# Patient Record
Sex: Female | Born: 1950 | Race: White | Hispanic: No | Marital: Married | Smoking: Never smoker
Health system: Southern US, Community
[De-identification: ages and names within clinical notes are randomized; demographics above are authoritative.]

## PROBLEM LIST (undated history)

## (undated) DIAGNOSIS — N390 Urinary tract infection, site not specified: Secondary | ICD-10-CM

## (undated) DIAGNOSIS — O24419 Gestational diabetes mellitus in pregnancy, unspecified control: Secondary | ICD-10-CM

## (undated) DIAGNOSIS — A389 Scarlet fever, uncomplicated: Secondary | ICD-10-CM

## (undated) DIAGNOSIS — Z8619 Personal history of other infectious and parasitic diseases: Secondary | ICD-10-CM

## (undated) DIAGNOSIS — B019 Varicella without complication: Secondary | ICD-10-CM

## (undated) DIAGNOSIS — G43909 Migraine, unspecified, not intractable, without status migrainosus: Secondary | ICD-10-CM

## (undated) DIAGNOSIS — K9041 Non-celiac gluten sensitivity: Secondary | ICD-10-CM

## (undated) DIAGNOSIS — Z889 Allergy status to unspecified drugs, medicaments and biological substances status: Secondary | ICD-10-CM

## (undated) DIAGNOSIS — K269 Duodenal ulcer, unspecified as acute or chronic, without hemorrhage or perforation: Secondary | ICD-10-CM

## (undated) HISTORY — DX: Non-celiac gluten sensitivity: K90.41

## (undated) HISTORY — DX: Personal history of other infectious and parasitic diseases: Z86.19

## (undated) HISTORY — DX: Scarlet fever, uncomplicated: A38.9

## (undated) HISTORY — DX: Varicella without complication: B01.9

## (undated) HISTORY — DX: Migraine, unspecified, not intractable, without status migrainosus: G43.909

## (undated) HISTORY — DX: Urinary tract infection, site not specified: N39.0

## (undated) HISTORY — DX: Allergy status to unspecified drugs, medicaments and biological substances: Z88.9

## (undated) HISTORY — DX: Duodenal ulcer, unspecified as acute or chronic, without hemorrhage or perforation: K26.9

## (undated) HISTORY — DX: Gestational diabetes mellitus in pregnancy, unspecified control: O24.419

---

## 2003-08-13 ENCOUNTER — Other Ambulatory Visit: Admission: RE | Admit: 2003-08-13 | Discharge: 2003-08-13 | Payer: Self-pay | Admitting: Obstetrics and Gynecology

## 2006-02-08 ENCOUNTER — Ambulatory Visit: Payer: Self-pay | Admitting: Internal Medicine

## 2015-07-28 ENCOUNTER — Telehealth: Payer: Self-pay | Admitting: Family Medicine

## 2015-07-28 NOTE — Telephone Encounter (Signed)
Dr. Regis Bill has agreed to see Destiny Cruz as a new pt in the Fall.  Please call the pt at [phone] to make that appointment.  Thanks!

## 2015-07-30 NOTE — Telephone Encounter (Signed)
Pt has been sch and req dec 2016

## 2015-12-03 ENCOUNTER — Ambulatory Visit (INDEPENDENT_AMBULATORY_CARE_PROVIDER_SITE_OTHER): Payer: PPO | Admitting: Internal Medicine

## 2015-12-03 ENCOUNTER — Encounter: Payer: Self-pay | Admitting: Internal Medicine

## 2015-12-03 VITALS — BP 144/90 | Temp 97.7°F | Ht 61.0 in | Wt 134.9 lb

## 2015-12-03 DIAGNOSIS — Z800 Family history of malignant neoplasm of digestive organs: Secondary | ICD-10-CM

## 2015-12-03 DIAGNOSIS — Z23 Encounter for immunization: Secondary | ICD-10-CM

## 2015-12-03 DIAGNOSIS — Z1211 Encounter for screening for malignant neoplasm of colon: Secondary | ICD-10-CM

## 2015-12-03 DIAGNOSIS — IMO0001 Reserved for inherently not codable concepts without codable children: Secondary | ICD-10-CM

## 2015-12-03 DIAGNOSIS — K9041 Non-celiac gluten sensitivity: Secondary | ICD-10-CM

## 2015-12-03 DIAGNOSIS — M810 Age-related osteoporosis without current pathological fracture: Secondary | ICD-10-CM

## 2015-12-03 DIAGNOSIS — K900 Celiac disease: Secondary | ICD-10-CM | POA: Diagnosis not present

## 2015-12-03 DIAGNOSIS — Z8632 Personal history of gestational diabetes: Secondary | ICD-10-CM

## 2015-12-03 DIAGNOSIS — E559 Vitamin D deficiency, unspecified: Secondary | ICD-10-CM

## 2015-12-03 DIAGNOSIS — R030 Elevated blood-pressure reading, without diagnosis of hypertension: Secondary | ICD-10-CM

## 2015-12-03 DIAGNOSIS — E7800 Pure hypercholesterolemia, unspecified: Secondary | ICD-10-CM

## 2015-12-03 NOTE — Progress Notes (Signed)
Pre visit review using our clinic review tool, if applicable. No additional management support is needed unless otherwise documented below in the visit note.  Chief Complaint  Patient presents with  . Establish Care    ostoporosis prev pcp gyne     HPI:  Nailah Luepke 64 y.o. comes in today for  New patient visit t. Her mom who has rheumatoid arthritis is a patient in our practice. Her previous care was from her GYN Dr. Irven [place] who she last saw in about 2011. Since then she has done quite well. She carries a diagnosis of osteoporosis at a fairly early age without a history of fracture. Thorough evaluation for secondary causes showed a low vitamin D level of 11 and presumed celiac disease or at least gluten sensitivity based on family history and change of symptoms with diet change. Her last bones density was about 5 years ago. Records are available for review. She was given bisphosphonate around 2004 fosomax and then actonel .  monthly  She does have a history of gestational diabetes and is working on this to prevent a problem but has not had lab blood tests done in years.  Lost  12 pounds + since august to exercise and  Low carb  Since august .   Currently she does get GI symptoms and perhaps joint symptoms when she eats gluten or goes off her dietary restriction. Of note her son when he was 37 or so was evaluated for celiac disease and although the biopsy was inconclusive it was felt he had celiac by Dr. Berdine Addison GI pediatric [facility]. Her daughter also developed symptoms when she was older but no evaluation. She is chosen to not have endoscopy and biopsy but to continue on her dietary intervention because it is made her feel some much better. She is exercising regularly eating healthy as possible states she has fewer sinus infections since eating correctly.  She has never had an endoscopy but was told she probably had a peptic ulcer while ulcer years ago by Dr. Tomma Rakers because she had  epigastric pain that radiated to her back she responded quite well to medication since she has been off an endoscopy was not felt to be necessary. She hasn't had recurrence of this problem.  She chooses to not have a colonoscopy her father had colon cancer but did not die from it had surgery to correct it had had anemia for while and he had refused colonoscopy until he passed out. He was also a patient in our practice. She has done stool cards in the past is amenable to that but hasn't done this in a few years.  Has not had a mammogram for a while asks about this.  Health Maintenance  Topic Date Due  . Hepatitis C Screening  02/12/1951  . ZOSTAVAX  09/22/2009  . MAMMOGRAM  10/23/2009  . COLONOSCOPY  12/02/2016 (Originally 09/23/1999)  . INFLUENZA VACCINE  07/06/2016  . PNA vac Low Risk Adult (2 of 2 - PPSV23) 12/02/2016  . TETANUS/TDAP  11/19/2017  . DEXA SCAN  Completed   Health Maintenance Review LIFESTYLE:  TAD social no more than one per day Exercise 3 d per week  Sugar beverages: No Sleep: 7-8 hours hh of 3  G2P2 Hx ascus pap neg hpv hr 2007    ROS: donantes blood  GEN/ HEENT: No fever, significant weight changes sweats headaches vision problems hearing changes, CV/ PULM; No chest pain shortness of breath cough, syncope,edema  change in  exercise tolerance. GI /GU: No adominal pain, vomiting, change in bowel habits. No blood in the stool. No significant GU symptoms. SKIN/HEME: ,no acute skin rashes suspicious lesions or bleeding. No lymphadenopathy, nodules, masses.  NEURO/ PSYCH:  No neurologic signs such as weakness numbness. No depression anxiety. IMM/ Allergy: No unusual infections.  Allergy .   REST of 12 system review negative except as per HPI   Past Medical History  Diagnosis Date  . Chicken pox   . Gestational diabetes   . Duodenal ulcer     clinical dx when younger responded to med and no recurrance no bleeding  . Migraines   . UTI (lower urinary tract  infection)     once   . Gluten intolerance     possible celiac  see text   . Hx of allergy     as a child hay fever     Family History  Problem Relation Age of Onset  . Colon cancer Father   . Rheum arthritis Mother   . Stroke Father   . Thyroid disease Daughter     partial thyroidectomy   . Celiac disease Son     probable    Social History   Social History  . Marital Status: Married    Spouse Name: N/A  . Number of Children: N/A  . Years of Education: N/A   Occupational History  . Retired    Social History Main Topics  . Smoking status: Never Smoker   . Smokeless tobacco: Never Used  . Alcohol Use: 0.0 oz/week    0 Standard drinks or equivalent per week     Comment: 1-2 Glasses of wine per day  . Drug Use: No  . Sexual Activity:    Partners: Male   Other Topics Concern  . 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 Cholecalciferol (VITAMIN D-3 PO) Take 4,000 Units by mouth daily.   No facility-administered encounter medications on file as of 12/03/2015.    EXAM:  BP 144/90 mmHg  Temp(Src) 97.7 F (36.5 C) (Oral)  Ht 5' 1"  (1.549 m)  Wt 134 lb 14.4 oz (61.19 kg)  BMI 25.50 kg/m2  Body mass index is 25.5 kg/(m^2).  Physical Exam: Vital signs reviewed [id number] is a well-developed well-nourished alert cooperative   who appears stated age in no acute distress.  HEENT: normocephalic atraumatic , Eyes: PERRL EOM's full, conjunctiva clear, Nares: paten,t no deformity discharge or tenderness., Ears: no deformityNECK: supple without masses, thyromegaly or bruits. CHEST/PULM:  Clear to auscultation and percussion breath sounds equal no wheeze , rales or rhonchi. CV: PMI is nondisplaced, S1 S2 no gallops, murmurs, rubs. Peripheral  pulses are full without delay.No JVD .  ABDOMEN: Bowel sounds normal nontender  No guard or rebound, no hepato splenomegal no CVA tenderness.   Extremtities:  No clubbing cyanosis or edema, no acute joint swelling or redness no focal atrophy NEURO:  Oriented x3, cranial nerves 3-12 appear to be intact, no obvious focal weakness,gait within normal limits  SKIN: No acute rashes normal turgor, color, no bruising or petechiae. PSYCH: Oriented, good eye contact, no obvious depression anxiety, cognition and judgment appear normal. LN: no cervical  adenopathy No noted  deficits in memory, attention, and speech.   No results found for: WBC, HGB, HCT, PLT, GLUCOSE, CHOL, TRIG, HDL, LDLDIRECT, LDLCALC, ALT, AST, NA, K, CL, CREATININE, BUN, CO2, TSH, PSA, INR, GLUF, HGBA1C, MICROALBUR Cholesterol up slightly records 230 range  Last pap 2011 neg  Last dexa se radiology 2011? -3.1 t score l 4-5  Td 12 15 2008 ASSESSMENT AND PLAN:  Discussed the following assessment and plan:  Osteoporosis - Plan: DG Bone Density, Basic metabolic panel, CBC with Differential/Platelet, Hemoglobin A1c, Hepatic function panel, Lipid panel, TSH, VITAMIN D 25 Hydroxy (Vit-D Deficiency, Fractures)  Colon cancer screening - Plan: Fecal occult blood, imunochemical  Gluten intolerance - poss celiac see text  - Plan: Basic metabolic panel, CBC with Differential/Platelet, Hemoglobin A1c, Hepatic function panel, Lipid panel, TSH, VITAMIN D 25 Hydroxy (Vit-D Deficiency, Fractures)  Need for vaccination with 13-polyvalent pneumococcal conjugate vaccine - Plan: Pneumococcal conjugate vaccine 13-valent  Hx gestational diabetes - monitor metabolic lsi  - Plan: Hemoglobin A1c  Elevated BP - check at home   intervention if needed to control - Plan: Basic metabolic panel  Vitamin D deficiency - Plan: VITAMIN D 25 Hydroxy (Vit-D Deficiency, Fractures)  Elevated cholesterol - Plan: Basic metabolic panel, Hepatic function panel, Lipid  panel  Family history of colon cancer father - pt decline colonscopy   Patient Care Team: Burnis Medin, MD as PCP - General (Internal Medicine) Clent Jacks, MD as Consulting Physician (Ophthalmology) Total visit 76mns > 50% spent counseling and coordinating care as indicated in above note and in instructions to patient .  Review of data with patient and plan     Patient Instructions  Get dexa  Scan appt.  Get a screening mammogram IFOB stool cards yearly for screening. Will  review records and plan  Fasting labs and then wellness exam  After dexa report back.  prevnar 13 . Today ..Marland Kitchen     WStandley Brooking Panosh M.D.

## 2015-12-03 NOTE — Patient Instructions (Signed)
Get dexa  Scan appt.  Get a screening mammogram IFOB stool cards yearly for screening. Will  review records and plan  Fasting labs and then wellness exam  After dexa report back.  prevnar 13 . Today .Marland Kitchen

## 2015-12-08 ENCOUNTER — Encounter: Payer: Self-pay | Admitting: Internal Medicine

## 2015-12-08 DIAGNOSIS — E559 Vitamin D deficiency, unspecified: Secondary | ICD-10-CM | POA: Insufficient documentation

## 2015-12-08 DIAGNOSIS — Z800 Family history of malignant neoplasm of digestive organs: Secondary | ICD-10-CM | POA: Insufficient documentation

## 2015-12-09 LAB — FECAL OCCULT BLOOD, IMMUNOCHEMICAL: Fecal Occult Bld: NEGATIVE

## 2015-12-09 NOTE — Progress Notes (Signed)
Quick Note:  Inform patient stool test negative for blood . Recheck this test yearly for screening colon cancer ______

## 2015-12-11 ENCOUNTER — Ambulatory Visit (INDEPENDENT_AMBULATORY_CARE_PROVIDER_SITE_OTHER)
Admission: RE | Admit: 2015-12-11 | Discharge: 2015-12-11 | Disposition: A | Discharge status: Home / Self Care | Payer: PPO | Source: Ambulatory Visit | Attending: Internal Medicine | Admitting: Internal Medicine

## 2015-12-11 DIAGNOSIS — M810 Age-related osteoporosis without current pathological fracture: Secondary | ICD-10-CM

## 2015-12-18 NOTE — Assessment & Plan Note (Signed)
dexa -3. Hip

## 2016-01-28 ENCOUNTER — Encounter: Payer: Self-pay | Admitting: Internal Medicine

## 2016-03-24 ENCOUNTER — Other Ambulatory Visit (INDEPENDENT_AMBULATORY_CARE_PROVIDER_SITE_OTHER): Payer: PPO

## 2016-03-24 DIAGNOSIS — R030 Elevated blood-pressure reading, without diagnosis of hypertension: Secondary | ICD-10-CM

## 2016-03-24 DIAGNOSIS — Z8632 Personal history of gestational diabetes: Secondary | ICD-10-CM

## 2016-03-24 DIAGNOSIS — IMO0001 Reserved for inherently not codable concepts without codable children: Secondary | ICD-10-CM

## 2016-03-24 DIAGNOSIS — E559 Vitamin D deficiency, unspecified: Secondary | ICD-10-CM

## 2016-03-24 DIAGNOSIS — M810 Age-related osteoporosis without current pathological fracture: Secondary | ICD-10-CM

## 2016-03-24 DIAGNOSIS — E7800 Pure hypercholesterolemia, unspecified: Secondary | ICD-10-CM

## 2016-03-24 DIAGNOSIS — K900 Celiac disease: Secondary | ICD-10-CM

## 2016-03-24 DIAGNOSIS — K9041 Non-celiac gluten sensitivity: Secondary | ICD-10-CM

## 2016-03-24 LAB — CBC WITH DIFFERENTIAL/PLATELET
Basophils Absolute: 0 10*3/uL (ref 0.0–0.1)
Basophils Relative: 0.7 % (ref 0.0–3.0)
Eosinophils Absolute: 0.2 10*3/uL (ref 0.0–0.7)
Eosinophils Relative: 3.9 % (ref 0.0–5.0)
HCT: 41.6 % (ref 36.0–46.0)
Hemoglobin: 14 g/dL (ref 12.0–15.0)
Lymphocytes Relative: 21.1 % (ref 12.0–46.0)
Lymphs Abs: 1.2 10*3/uL (ref 0.7–4.0)
MCHC: 33.7 g/dL (ref 30.0–36.0)
MCV: 93 fl (ref 78.0–100.0)
Monocytes Absolute: 0.5 10*3/uL (ref 0.1–1.0)
Monocytes Relative: 9.7 % (ref 3.0–12.0)
Neutro Abs: 3.5 10*3/uL (ref 1.4–7.7)
Neutrophils Relative %: 64.6 % (ref 43.0–77.0)
Platelets: 180 10*3/uL (ref 150.0–400.0)
RBC: 4.48 Mil/uL (ref 3.87–5.11)
RDW: 13.1 % (ref 11.5–15.5)
WBC: 5.5 10*3/uL (ref 4.0–10.5)

## 2016-03-24 LAB — BASIC METABOLIC PANEL
BUN: 12 mg/dL (ref 6–23)
CO2: 29 mEq/L (ref 19–32)
Calcium: 9.7 mg/dL (ref 8.4–10.5)
Chloride: 105 mEq/L (ref 96–112)
Creatinine, Ser: 0.75 mg/dL (ref 0.40–1.20)
GFR: 82.05 mL/min (ref >60.00)
Glucose, Bld: 90 mg/dL (ref 70–99)
Potassium: 4.5 mEq/L (ref 3.5–5.1)
Sodium: 141 mEq/L (ref 135–145)

## 2016-03-24 LAB — HEPATIC FUNCTION PANEL
ALT: 12 U/L (ref 0–35)
AST: 12 U/L (ref 0–37)
Albumin: 4.2 g/dL (ref 3.5–5.2)
Alkaline Phosphatase: 66 U/L (ref 39–117)
Bilirubin, Direct: 0.1 mg/dL (ref 0.0–0.3)
Total Bilirubin: 0.5 mg/dL (ref 0.2–1.2)
Total Protein: 6.7 g/dL (ref 6.0–8.3)

## 2016-03-24 LAB — LIPID PANEL
Cholesterol: 229 mg/dL — ABNORMAL HIGH (ref 0–200)
HDL: 57.8 mg/dL (ref >39.00)
LDL Cholesterol: 152 mg/dL — ABNORMAL HIGH (ref 0–99)
NonHDL: 171.51
Total CHOL/HDL Ratio: 4
Triglycerides: 98 mg/dL (ref 0.0–149.0)
VLDL: 19.6 mg/dL (ref 0.0–40.0)

## 2016-03-24 LAB — HEMOGLOBIN A1C: Hgb A1c MFr Bld: 5.1 % (ref 4.6–6.5)

## 2016-03-24 LAB — TSH: TSH: 1.85 u[IU]/mL (ref 0.35–4.50)

## 2016-03-24 LAB — VITAMIN D 25 HYDROXY (VIT D DEFICIENCY, FRACTURES): VITD: 81.82 ng/mL (ref 30.00–100.00)

## 2016-03-30 NOTE — Progress Notes (Signed)
Pre visit review using our clinic review tool, if applicable. No additional management support is needed unless otherwise documented below in the visit note.  Chief Complaint  Patient presents with  . Medicare Wellness    bp up today cause of stress has been coming down    HPI: Destiny Cruz 65 y.o. comes in today for Preventive Medicare wellness visit .Since last visit  bp is up cause of stress.  Husband under eval for neuro sx  Getting mri now.   Stopped  Wine was up to 1 bottle per night    This month  And  More than usually   bp coming down  In 140 150 ocass 120 range at home . exercising losing weight  With diet changes . Retired last fall and doing well with this   Health Maintenance  Topic Date Due  . ZOSTAVAX  09/22/2009  . MAMMOGRAM  10/23/2009  . COLONOSCOPY  12/02/2016 (Originally 09/23/1999)  . Hepatitis C Screening  03/31/2017 (Originally 01-23-1951)  . INFLUENZA VACCINE  07/06/2016  . PNA vac Low Risk Adult (2 of 2 - PPSV23) 12/02/2016  . TETANUS/TDAP  11/19/2017  . DEXA SCAN  Completed   Health Maintenance Review LIFESTYLE:  TAD no  Cut back on  Wine  Sugar beverages: no hx gestational dm .  Sleep: about 7-8 hours   Exercise   Every day   3 miles .   17 pound weight loss    eating different  MEDICARE DOCUMENT QUESTIONS  TO SCAN     Hearing: ok  Vision:  No limitations at present . Last eye check UTD  Safety:  Has smoke detector and wears seat belts.  No firearms. No excess sun exposure. Sees dentist regularly.  Falls: n  Advance directive :  Reviewed  Has one.  Memory: Felt to be good  , no concern from her or her family.  Depression: No anhedonia unusual crying or depressive symptoms  Nutrition: Eats well balanced diet; adequate calcium and vitamin D. No swallowing chewing problems.  Injury: no major injuries in the last six months.  Other healthcare providers:  Reviewed today .  Social:  Lives with spouse married. No pets.    Preventive parameters: up-to-date  Reviewed   ADLS:   There are no problems or need for assistance  driving, feeding, obtaining food, dressing, toileting and bathing, managing money using phone. She is independent.     ROS:  GEN/ HEENT: No fever, significant weight changes sweats headaches vision problems hearing changes, CV/ PULM; No chest pain shortness of breath cough, syncope,edema  change in exercise tolerance. GI /GU: No adominal pain, vomiting, change in bowel habits. No blood in the stool. No significant GU symptoms. SKIN/HEME: ,no acute skin rashes suspicious lesions or bleeding. No lymphadenopathy, nodules, masses.  NEURO/ PSYCH:  No neurologic signs such as weakness numbness. No depression anxiety. IMM/ Allergy: No unusual infections.  Allergy .   REST of 12 system review negative except as per HPI   Past Medical History  Diagnosis Date  . Chicken pox   . Gestational diabetes   . Duodenal ulcer     clinical dx when younger responded to med and no recurrance no bleeding  . Migraines   . UTI (lower urinary tract infection)     once   . Gluten intolerance     possible celiac  see text   . Hx of allergy     as a child hay fever   .  Pre visit review using our clinic review tool, if applicable. No additional management support is needed unless otherwise documented below in the visit note.  Chief Complaint  Patient presents with  . Medicare Wellness    bp up today cause of stress has been coming down    HPI: Destiny Cruz 65 y.o. comes in today for Preventive Medicare wellness visit .Since last visit  bp is up cause of stress.  Husband under eval for neuro sx  Getting mri now.   Stopped  Wine was up to 1 bottle per night    This month  And  More than usually   bp coming down  In 140 150 ocass 120 range at home . exercising losing weight  With diet changes . Retired last fall and doing well with this   Health Maintenance  Topic Date Due  . ZOSTAVAX  09/22/2009  . MAMMOGRAM  10/23/2009  . COLONOSCOPY  12/02/2016 (Originally 09/23/1999)  . Hepatitis C Screening  03/31/2017 (Originally 01-23-1951)  . INFLUENZA VACCINE  07/06/2016  . PNA vac Low Risk Adult (2 of 2 - PPSV23) 12/02/2016  . TETANUS/TDAP  11/19/2017  . DEXA SCAN  Completed   Health Maintenance Review LIFESTYLE:  TAD no  Cut back on  Wine  Sugar beverages: no hx gestational dm .  Sleep: about 7-8 hours   Exercise   Every day   3 miles .   17 pound weight loss    eating different  MEDICARE DOCUMENT QUESTIONS  TO SCAN     Hearing: ok  Vision:  No limitations at present . Last eye check UTD  Safety:  Has smoke detector and wears seat belts.  No firearms. No excess sun exposure. Sees dentist regularly.  Falls: n  Advance directive :  Reviewed  Has one.  Memory: Felt to be good  , no concern from her or her family.  Depression: No anhedonia unusual crying or depressive symptoms  Nutrition: Eats well balanced diet; adequate calcium and vitamin D. No swallowing chewing problems.  Injury: no major injuries in the last six months.  Other healthcare providers:  Reviewed today .  Social:  Lives with spouse married. No pets.    Preventive parameters: up-to-date  Reviewed   ADLS:   There are no problems or need for assistance  driving, feeding, obtaining food, dressing, toileting and bathing, managing money using phone. She is independent.     ROS:  GEN/ HEENT: No fever, significant weight changes sweats headaches vision problems hearing changes, CV/ PULM; No chest pain shortness of breath cough, syncope,edema  change in exercise tolerance. GI /GU: No adominal pain, vomiting, change in bowel habits. No blood in the stool. No significant GU symptoms. SKIN/HEME: ,no acute skin rashes suspicious lesions or bleeding. No lymphadenopathy, nodules, masses.  NEURO/ PSYCH:  No neurologic signs such as weakness numbness. No depression anxiety. IMM/ Allergy: No unusual infections.  Allergy .   REST of 12 system review negative except as per HPI   Past Medical History  Diagnosis Date  . Chicken pox   . Gestational diabetes   . Duodenal ulcer     clinical dx when younger responded to med and no recurrance no bleeding  . Migraines   . UTI (lower urinary tract infection)     once   . Gluten intolerance     possible celiac  see text   . Hx of allergy     as a child hay fever   .  Pre visit review using our clinic review tool, if applicable. No additional management support is needed unless otherwise documented below in the visit note.  Chief Complaint  Patient presents with  . Medicare Wellness    bp up today cause of stress has been coming down    HPI: Destiny Cruz 65 y.o. comes in today for Preventive Medicare wellness visit .Since last visit  bp is up cause of stress.  Husband under eval for neuro sx  Getting mri now.   Stopped  Wine was up to 1 bottle per night    This month  And  More than usually   bp coming down  In 140 150 ocass 120 range at home . exercising losing weight  With diet changes . Retired last fall and doing well with this   Health Maintenance  Topic Date Due  . ZOSTAVAX  09/22/2009  . MAMMOGRAM  10/23/2009  . COLONOSCOPY  12/02/2016 (Originally 09/23/1999)  . Hepatitis C Screening  03/31/2017 (Originally 01-23-1951)  . INFLUENZA VACCINE  07/06/2016  . PNA vac Low Risk Adult (2 of 2 - PPSV23) 12/02/2016  . TETANUS/TDAP  11/19/2017  . DEXA SCAN  Completed   Health Maintenance Review LIFESTYLE:  TAD no  Cut back on  Wine  Sugar beverages: no hx gestational dm .  Sleep: about 7-8 hours   Exercise   Every day   3 miles .   17 pound weight loss    eating different  MEDICARE DOCUMENT QUESTIONS  TO SCAN     Hearing: ok  Vision:  No limitations at present . Last eye check UTD  Safety:  Has smoke detector and wears seat belts.  No firearms. No excess sun exposure. Sees dentist regularly.  Falls: n  Advance directive :  Reviewed  Has one.  Memory: Felt to be good  , no concern from her or her family.  Depression: No anhedonia unusual crying or depressive symptoms  Nutrition: Eats well balanced diet; adequate calcium and vitamin D. No swallowing chewing problems.  Injury: no major injuries in the last six months.  Other healthcare providers:  Reviewed today .  Social:  Lives with spouse married. No pets.    Preventive parameters: up-to-date  Reviewed   ADLS:   There are no problems or need for assistance  driving, feeding, obtaining food, dressing, toileting and bathing, managing money using phone. She is independent.     ROS:  GEN/ HEENT: No fever, significant weight changes sweats headaches vision problems hearing changes, CV/ PULM; No chest pain shortness of breath cough, syncope,edema  change in exercise tolerance. GI /GU: No adominal pain, vomiting, change in bowel habits. No blood in the stool. No significant GU symptoms. SKIN/HEME: ,no acute skin rashes suspicious lesions or bleeding. No lymphadenopathy, nodules, masses.  NEURO/ PSYCH:  No neurologic signs such as weakness numbness. No depression anxiety. IMM/ Allergy: No unusual infections.  Allergy .   REST of 12 system review negative except as per HPI   Past Medical History  Diagnosis Date  . Chicken pox   . Gestational diabetes   . Duodenal ulcer     clinical dx when younger responded to med and no recurrance no bleeding  . Migraines   . UTI (lower urinary tract infection)     once   . Gluten intolerance     possible celiac  see text   . Hx of allergy     as a child hay fever   .  Pre visit review using our clinic review tool, if applicable. No additional management support is needed unless otherwise documented below in the visit note.  Chief Complaint  Patient presents with  . Medicare Wellness    bp up today cause of stress has been coming down    HPI: Destiny Cruz 65 y.o. comes in today for Preventive Medicare wellness visit .Since last visit  bp is up cause of stress.  Husband under eval for neuro sx  Getting mri now.   Stopped  Wine was up to 1 bottle per night    This month  And  More than usually   bp coming down  In 140 150 ocass 120 range at home . exercising losing weight  With diet changes . Retired last fall and doing well with this   Health Maintenance  Topic Date Due  . ZOSTAVAX  09/22/2009  . MAMMOGRAM  10/23/2009  . COLONOSCOPY  12/02/2016 (Originally 09/23/1999)  . Hepatitis C Screening  03/31/2017 (Originally 01-23-1951)  . INFLUENZA VACCINE  07/06/2016  . PNA vac Low Risk Adult (2 of 2 - PPSV23) 12/02/2016  . TETANUS/TDAP  11/19/2017  . DEXA SCAN  Completed   Health Maintenance Review LIFESTYLE:  TAD no  Cut back on  Wine  Sugar beverages: no hx gestational dm .  Sleep: about 7-8 hours   Exercise   Every day   3 miles .   17 pound weight loss    eating different  MEDICARE DOCUMENT QUESTIONS  TO SCAN     Hearing: ok  Vision:  No limitations at present . Last eye check UTD  Safety:  Has smoke detector and wears seat belts.  No firearms. No excess sun exposure. Sees dentist regularly.  Falls: n  Advance directive :  Reviewed  Has one.  Memory: Felt to be good  , no concern from her or her family.  Depression: No anhedonia unusual crying or depressive symptoms  Nutrition: Eats well balanced diet; adequate calcium and vitamin D. No swallowing chewing problems.  Injury: no major injuries in the last six months.  Other healthcare providers:  Reviewed today .  Social:  Lives with spouse married. No pets.    Preventive parameters: up-to-date  Reviewed   ADLS:   There are no problems or need for assistance  driving, feeding, obtaining food, dressing, toileting and bathing, managing money using phone. She is independent.     ROS:  GEN/ HEENT: No fever, significant weight changes sweats headaches vision problems hearing changes, CV/ PULM; No chest pain shortness of breath cough, syncope,edema  change in exercise tolerance. GI /GU: No adominal pain, vomiting, change in bowel habits. No blood in the stool. No significant GU symptoms. SKIN/HEME: ,no acute skin rashes suspicious lesions or bleeding. No lymphadenopathy, nodules, masses.  NEURO/ PSYCH:  No neurologic signs such as weakness numbness. No depression anxiety. IMM/ Allergy: No unusual infections.  Allergy .   REST of 12 system review negative except as per HPI   Past Medical History  Diagnosis Date  . Chicken pox   . Gestational diabetes   . Duodenal ulcer     clinical dx when younger responded to med and no recurrance no bleeding  . Migraines   . UTI (lower urinary tract infection)     once   . Gluten intolerance     possible celiac  see text   . Hx of allergy     as a child hay fever   .  Pre visit review using our clinic review tool, if applicable. No additional management support is needed unless otherwise documented below in the visit note.  Chief Complaint  Patient presents with  . Medicare Wellness    bp up today cause of stress has been coming down    HPI: Destiny Cruz 65 y.o. comes in today for Preventive Medicare wellness visit .Since last visit  bp is up cause of stress.  Husband under eval for neuro sx  Getting mri now.   Stopped  Wine was up to 1 bottle per night    This month  And  More than usually   bp coming down  In 140 150 ocass 120 range at home . exercising losing weight  With diet changes . Retired last fall and doing well with this   Health Maintenance  Topic Date Due  . ZOSTAVAX  09/22/2009  . MAMMOGRAM  10/23/2009  . COLONOSCOPY  12/02/2016 (Originally 09/23/1999)  . Hepatitis C Screening  03/31/2017 (Originally 01-23-1951)  . INFLUENZA VACCINE  07/06/2016  . PNA vac Low Risk Adult (2 of 2 - PPSV23) 12/02/2016  . TETANUS/TDAP  11/19/2017  . DEXA SCAN  Completed   Health Maintenance Review LIFESTYLE:  TAD no  Cut back on  Wine  Sugar beverages: no hx gestational dm .  Sleep: about 7-8 hours   Exercise   Every day   3 miles .   17 pound weight loss    eating different  MEDICARE DOCUMENT QUESTIONS  TO SCAN     Hearing: ok  Vision:  No limitations at present . Last eye check UTD  Safety:  Has smoke detector and wears seat belts.  No firearms. No excess sun exposure. Sees dentist regularly.  Falls: n  Advance directive :  Reviewed  Has one.  Memory: Felt to be good  , no concern from her or her family.  Depression: No anhedonia unusual crying or depressive symptoms  Nutrition: Eats well balanced diet; adequate calcium and vitamin D. No swallowing chewing problems.  Injury: no major injuries in the last six months.  Other healthcare providers:  Reviewed today .  Social:  Lives with spouse married. No pets.    Preventive parameters: up-to-date  Reviewed   ADLS:   There are no problems or need for assistance  driving, feeding, obtaining food, dressing, toileting and bathing, managing money using phone. She is independent.     ROS:  GEN/ HEENT: No fever, significant weight changes sweats headaches vision problems hearing changes, CV/ PULM; No chest pain shortness of breath cough, syncope,edema  change in exercise tolerance. GI /GU: No adominal pain, vomiting, change in bowel habits. No blood in the stool. No significant GU symptoms. SKIN/HEME: ,no acute skin rashes suspicious lesions or bleeding. No lymphadenopathy, nodules, masses.  NEURO/ PSYCH:  No neurologic signs such as weakness numbness. No depression anxiety. IMM/ Allergy: No unusual infections.  Allergy .   REST of 12 system review negative except as per HPI   Past Medical History  Diagnosis Date  . Chicken pox   . Gestational diabetes   . Duodenal ulcer     clinical dx when younger responded to med and no recurrance no bleeding  . Migraines   . UTI (lower urinary tract infection)     once   . Gluten intolerance     possible celiac  see text   . Hx of allergy     as a child hay fever   .  Pre visit review using our clinic review tool, if applicable. No additional management support is needed unless otherwise documented below in the visit note.  Chief Complaint  Patient presents with  . Medicare Wellness    bp up today cause of stress has been coming down    HPI: Destiny Cruz 65 y.o. comes in today for Preventive Medicare wellness visit .Since last visit  bp is up cause of stress.  Husband under eval for neuro sx  Getting mri now.   Stopped  Wine was up to 1 bottle per night    This month  And  More than usually   bp coming down  In 140 150 ocass 120 range at home . exercising losing weight  With diet changes . Retired last fall and doing well with this   Health Maintenance  Topic Date Due  . ZOSTAVAX  09/22/2009  . MAMMOGRAM  10/23/2009  . COLONOSCOPY  12/02/2016 (Originally 09/23/1999)  . Hepatitis C Screening  03/31/2017 (Originally 01-23-1951)  . INFLUENZA VACCINE  07/06/2016  . PNA vac Low Risk Adult (2 of 2 - PPSV23) 12/02/2016  . TETANUS/TDAP  11/19/2017  . DEXA SCAN  Completed   Health Maintenance Review LIFESTYLE:  TAD no  Cut back on  Wine  Sugar beverages: no hx gestational dm .  Sleep: about 7-8 hours   Exercise   Every day   3 miles .   17 pound weight loss    eating different  MEDICARE DOCUMENT QUESTIONS  TO SCAN     Hearing: ok  Vision:  No limitations at present . Last eye check UTD  Safety:  Has smoke detector and wears seat belts.  No firearms. No excess sun exposure. Sees dentist regularly.  Falls: n  Advance directive :  Reviewed  Has one.  Memory: Felt to be good  , no concern from her or her family.  Depression: No anhedonia unusual crying or depressive symptoms  Nutrition: Eats well balanced diet; adequate calcium and vitamin D. No swallowing chewing problems.  Injury: no major injuries in the last six months.  Other healthcare providers:  Reviewed today .  Social:  Lives with spouse married. No pets.    Preventive parameters: up-to-date  Reviewed   ADLS:   There are no problems or need for assistance  driving, feeding, obtaining food, dressing, toileting and bathing, managing money using phone. She is independent.     ROS:  GEN/ HEENT: No fever, significant weight changes sweats headaches vision problems hearing changes, CV/ PULM; No chest pain shortness of breath cough, syncope,edema  change in exercise tolerance. GI /GU: No adominal pain, vomiting, change in bowel habits. No blood in the stool. No significant GU symptoms. SKIN/HEME: ,no acute skin rashes suspicious lesions or bleeding. No lymphadenopathy, nodules, masses.  NEURO/ PSYCH:  No neurologic signs such as weakness numbness. No depression anxiety. IMM/ Allergy: No unusual infections.  Allergy .   REST of 12 system review negative except as per HPI   Past Medical History  Diagnosis Date  . Chicken pox   . Gestational diabetes   . Duodenal ulcer     clinical dx when younger responded to med and no recurrance no bleeding  . Migraines   . UTI (lower urinary tract infection)     once   . Gluten intolerance     possible celiac  see text   . Hx of allergy     as a child hay fever   .

## 2016-03-31 ENCOUNTER — Ambulatory Visit (INDEPENDENT_AMBULATORY_CARE_PROVIDER_SITE_OTHER): Payer: PPO | Admitting: Internal Medicine

## 2016-03-31 ENCOUNTER — Encounter: Payer: Self-pay | Admitting: Internal Medicine

## 2016-03-31 VITALS — BP 158/80 | Temp 98.3°F | Ht 61.0 in | Wt 129.9 lb

## 2016-03-31 DIAGNOSIS — R030 Elevated blood-pressure reading, without diagnosis of hypertension: Secondary | ICD-10-CM | POA: Diagnosis not present

## 2016-03-31 DIAGNOSIS — Z8632 Personal history of gestational diabetes: Secondary | ICD-10-CM | POA: Diagnosis not present

## 2016-03-31 DIAGNOSIS — Z136 Encounter for screening for cardiovascular disorders: Secondary | ICD-10-CM

## 2016-03-31 DIAGNOSIS — E785 Hyperlipidemia, unspecified: Secondary | ICD-10-CM | POA: Diagnosis not present

## 2016-03-31 DIAGNOSIS — Z0000 Encounter for general adult medical examination without abnormal findings: Secondary | ICD-10-CM

## 2016-03-31 DIAGNOSIS — IMO0001 Reserved for inherently not codable concepts without codable children: Secondary | ICD-10-CM

## 2016-03-31 NOTE — Patient Instructions (Addendum)
Continue monitoring  Consider adding  acei such as lisinopril for bp help.   Continue lifestyle intervention healthy eating and exercise .  Blood lab work is good.  Please send in readings  In 1 months my chart and can make decision to add meds.   Health Maintenance, Female Adopting a healthy lifestyle and getting preventive care can go a long way to promote health and wellness. Talk with your health care provider about what schedule of regular examinations is right for you. This is a good chance for you to check in with your provider about disease prevention and staying healthy. In between checkups, there are plenty of things you can do on your own. Experts have done a lot of research about which lifestyle changes and preventive measures are most likely to keep you healthy. Ask your health care provider for more information. WEIGHT AND DIET  Eat a healthy diet  Be sure to include plenty of vegetables, fruits, low-fat dairy products, and lean protein.  Do not eat a lot of foods high in solid fats, added sugars, or salt.  Get regular exercise. This is one of the most important things you can do for your health.  Most adults should exercise for at least 150 minutes each week. The exercise should increase your heart rate and make you sweat (moderate-intensity exercise).  Most adults should also do strengthening exercises at least twice a week. This is in addition to the moderate-intensity exercise.  Maintain a healthy weight  Body mass index (BMI) is a measurement that can be used to identify possible weight problems. It estimates body fat based on height and weight. Your health care provider can help determine your BMI and help you achieve or maintain a healthy weight.  For females 37 years of age and older:   A BMI below 18.5 is considered underweight.  A BMI of 18.5 to 24.9 is normal.  A BMI of 25 to 29.9 is considered overweight.  A BMI of 30 and above is considered obese.   Watch levels of cholesterol and blood lipids  You should start having your blood tested for lipids and cholesterol at 67 years of age, then have this test every 5 years.  You may need to have your cholesterol levels checked more often if:  Your lipid or cholesterol levels are high.  You are older than 67 years of age.  You are at high risk for heart disease.  CANCER SCREENING   Lung Cancer  Lung cancer screening is recommended for adults 50-42 years old who are at high risk for lung cancer because of a history of smoking.  A yearly low-dose CT scan of the lungs is recommended for people who:  Currently smoke.  Have quit within the past 15 years.  Have at least a 30-pack-year history of smoking. A pack year is smoking an average of one pack of cigarettes a day for 1 year.  Yearly screening should continue until it has been 15 years since you quit.  Yearly screening should stop if you develop a health problem that would prevent you from having lung cancer treatment.  Breast Cancer  Practice breast self-awareness. This means understanding how your breasts normally appear and feel.  It also means doing regular breast self-exams. Let your health care provider know about any changes, no matter how small.  If you are in your 20s or 30s, you should have a clinical breast exam (CBE) by a health care provider every 1-3 years as  part of a regular health exam.  If you are 40 or older, have a CBE every year. Also consider having a breast X-ray (mammogram) every year.  If you have a family history of breast cancer, talk to your health care provider about genetic screening.  If you are at high risk for breast cancer, talk to your health care provider about having an MRI and a mammogram every year.  Breast cancer gene (BRCA) assessment is recommended for women who have family members with BRCA-related cancers. BRCA-related cancers  include:  Breast.  Ovarian.  Tubal.  Peritoneal cancers.  Results of the assessment will determine the need for genetic counseling and BRCA1 and BRCA2 testing. Cervical Cancer Your health care provider may recommend that you be screened regularly for cancer of the pelvic organs (ovaries, uterus, and vagina). This screening involves a pelvic examination, including checking for microscopic changes to the surface of your cervix (Pap test). You may be encouraged to have this screening done every 3 years, beginning at age 21.  For women ages 30-65, health care providers may recommend pelvic exams and Pap testing every 3 years, or they may recommend the Pap and pelvic exam, combined with testing for human papilloma virus (HPV), every 5 years. Some types of HPV increase your risk of cervical cancer. Testing for HPV may also be done on women of any age with unclear Pap test results.  Other health care providers may not recommend any screening for nonpregnant women who are considered low risk for pelvic cancer and who do not have symptoms. Ask your health care provider if a screening pelvic exam is right for you.  If you have had past treatment for cervical cancer or a condition that could lead to cancer, you need Pap tests and screening for cancer for at least 20 years after your treatment. If Pap tests have been discontinued, your risk factors (such as having a new sexual partner) need to be reassessed to determine if screening should resume. Some women have medical problems that increase the chance of getting cervical cancer. In these cases, your health care provider may recommend more frequent screening and Pap tests. Colorectal Cancer  This type of cancer can be detected and often prevented.  Routine colorectal cancer screening usually begins at 67 years of age and continues through 67 years of age.  Your health care provider may recommend screening at an earlier age if you have risk factors for  colon cancer.  Your health care provider may also recommend using home test kits to check for hidden blood in the stool.  A small camera at the end of a tube can be used to examine your colon directly (sigmoidoscopy or colonoscopy). This is done to check for the earliest forms of colorectal cancer.  Routine screening usually begins at age 50.  Direct examination of the colon should be repeated every 5-10 years through 67 years of age. However, you may need to be screened more often if early forms of precancerous polyps or small growths are found. Skin Cancer  Check your skin from head to toe regularly.  Tell your health care provider about any new moles or changes in moles, especially if there is a change in a mole's shape or color.  Also tell your health care provider if you have a mole that is larger than the size of a pencil eraser.  Always use sunscreen. Apply sunscreen liberally and repeatedly throughout the day.  Protect yourself by wearing long sleeves, pants,   a wide-brimmed hat, and sunglasses whenever you are outside. HEART DISEASE, DIABETES, AND HIGH BLOOD PRESSURE   High blood pressure causes heart disease and increases the risk of stroke. High blood pressure is more likely to develop in:  People who have blood pressure in the high end of the normal range (130-139/85-89 mm Hg).  People who are overweight or obese.  People who are African American.  If you are 18-39 years of age, have your blood pressure checked every 3-5 years. If you are 40 years of age or older, have your blood pressure checked every year. You should have your blood pressure measured twice--once when you are at a hospital or clinic, and once when you are not at a hospital or clinic. Record the average of the two measurements. To check your blood pressure when you are not at a hospital or clinic, you can use:  An automated blood pressure machine at a pharmacy.  A home blood pressure monitor.  If you  are between 55 years and 79 years old, ask your health care provider if you should take aspirin to prevent strokes.  Have regular diabetes screenings. This involves taking a blood sample to check your fasting blood sugar level.  If you are at a normal weight and have a low risk for diabetes, have this test once every three years after 67 years of age.  If you are overweight and have a high risk for diabetes, consider being tested at a younger age or more often. PREVENTING INFECTION  Hepatitis B  If you have a higher risk for hepatitis B, you should be screened for this virus. You are considered at high risk for hepatitis B if:  You were born in a country where hepatitis B is common. Ask your health care provider which countries are considered high risk.  Your parents were born in a high-risk country, and you have not been immunized against hepatitis B (hepatitis B vaccine).  You have HIV or AIDS.  You use needles to inject street drugs.  You live with someone who has hepatitis B.  You have had sex with someone who has hepatitis B.  You get hemodialysis treatment.  You take certain medicines for conditions, including cancer, organ transplantation, and autoimmune conditions. Hepatitis C  Blood testing is recommended for:  Everyone born from 1945 through 1965.  Anyone with known risk factors for hepatitis C. Sexually transmitted infections (STIs)  You should be screened for sexually transmitted infections (STIs) including gonorrhea and chlamydia if:  You are sexually active and are younger than 67 years of age.  You are older than 67 years of age and your health care provider tells you that you are at risk for this type of infection.  Your sexual activity has changed since you were last screened and you are at an increased risk for chlamydia or gonorrhea. Ask your health care provider if you are at risk.  If you do not have HIV, but are at risk, it may be recommended that you  take a prescription medicine daily to prevent HIV infection. This is called pre-exposure prophylaxis (PrEP). You are considered at risk if:  You are sexually active and do not regularly use condoms or know the HIV status of your partner(s).  You take drugs by injection.  You are sexually active with a partner who has HIV. Talk with your health care provider about whether you are at high risk of being infected with HIV. If you choose to begin   PrEP, you should first be tested for HIV. You should then be tested every 3 months for as long as you are taking PrEP.  PREGNANCY   If you are premenopausal and you may become pregnant, ask your health care provider about preconception counseling.  If you may become pregnant, take 400 to 800 micrograms (mcg) of folic acid every day.  If you want to prevent pregnancy, talk to your health care provider about birth control (contraception). OSTEOPOROSIS AND MENOPAUSE   Osteoporosis is a disease in which the bones lose minerals and strength with aging. This can result in serious bone fractures. Your risk for osteoporosis can be identified using a bone density scan.  If you are 41 years of age or older, or if you are at risk for osteoporosis and fractures, ask your health care provider if you should be screened.  Ask your health care provider whether you should take a calcium or vitamin D supplement to lower your risk for osteoporosis.  Menopause may have certain physical symptoms and risks.  Hormone replacement therapy may reduce some of these symptoms and risks. Talk to your health care provider about whether hormone replacement therapy is right for you.  HOME CARE INSTRUCTIONS   Schedule regular health, dental, and eye exams.  Stay current with your immunizations.   Do not use any tobacco products including cigarettes, chewing tobacco, or electronic cigarettes.  If you are pregnant, do not drink alcohol.  If you are breastfeeding, limit how  much and how often you drink alcohol.  Limit alcohol intake to no more than 1 drink per day for nonpregnant women. One drink equals 12 ounces of beer, 5 ounces of wine, or 1 ounces of hard liquor.  Do not use street drugs.  Do not share needles.  Ask your health care provider for help if you need support or information about quitting drugs.  Tell your health care provider if you often feel depressed.  Tell your health care provider if you have ever been abused or do not feel safe at home.   This information is not intended to replace advice given to you by your health care provider. Make sure you discuss any questions you have with your health care provider.   Document Released: 06/07/2011 Document Revised: 12/13/2014 Document Reviewed: 10/24/2013 Elsevier Interactive Patient Education [employer].

## 2016-04-30 ENCOUNTER — Encounter: Payer: Self-pay | Admitting: Internal Medicine

## 2016-07-22 DIAGNOSIS — H04123 Dry eye syndrome of bilateral lacrimal glands: Secondary | ICD-10-CM | POA: Diagnosis not present

## 2016-07-22 DIAGNOSIS — H25013 Cortical age-related cataract, bilateral: Secondary | ICD-10-CM | POA: Diagnosis not present

## 2016-08-31 ENCOUNTER — Encounter: Payer: Self-pay | Admitting: Family Medicine

## 2016-09-16 ENCOUNTER — Encounter: Payer: Self-pay | Admitting: Internal Medicine

## 2016-11-08 ENCOUNTER — Encounter: Payer: Self-pay | Admitting: Internal Medicine

## 2016-11-08 ENCOUNTER — Other Ambulatory Visit: Payer: Self-pay | Admitting: Family Medicine

## 2016-11-08 DIAGNOSIS — Z7289 Other problems related to lifestyle: Secondary | ICD-10-CM

## 2016-11-09 ENCOUNTER — Other Ambulatory Visit (INDEPENDENT_AMBULATORY_CARE_PROVIDER_SITE_OTHER): Payer: PPO

## 2016-11-09 DIAGNOSIS — Z7289 Other problems related to lifestyle: Secondary | ICD-10-CM | POA: Diagnosis not present

## 2016-11-10 LAB — HEPATITIS C ANTIBODY: HCV Ab: NEGATIVE

## 2016-11-19 ENCOUNTER — Encounter: Payer: Self-pay | Admitting: Internal Medicine

## 2016-12-03 ENCOUNTER — Telehealth: Payer: Self-pay | Admitting: Internal Medicine

## 2016-12-03 NOTE — Telephone Encounter (Signed)
Pt would like to know if she is due for pneumonia vaccine

## 2016-12-03 NOTE — Telephone Encounter (Signed)
Pt has been sch

## 2016-12-03 NOTE — Telephone Encounter (Signed)
Pt is due for pneumonia vaccine (Pneumovax).  She can make an appointment to come in for vaccine or due for yearly in April and can get at that time.  Please help her schedule correct appointment.  Thanks!!

## 2016-12-09 ENCOUNTER — Ambulatory Visit (INDEPENDENT_AMBULATORY_CARE_PROVIDER_SITE_OTHER): Payer: PPO | Admitting: *Deleted

## 2016-12-09 DIAGNOSIS — Z23 Encounter for immunization: Secondary | ICD-10-CM | POA: Diagnosis not present

## 2017-08-26 ENCOUNTER — Encounter: Payer: Self-pay | Admitting: Internal Medicine

## 2017-09-10 ENCOUNTER — Encounter: Payer: Self-pay | Admitting: Internal Medicine

## 2017-10-31 ENCOUNTER — Encounter: Payer: Self-pay | Admitting: Internal Medicine

## 2017-11-01 NOTE — Telephone Encounter (Signed)
Pt requesting Cologuard See e-mail

## 2017-11-01 NOTE — Telephone Encounter (Signed)
colo guard is used for  Average risk    Patients   Who do not have a first degree relative with colon  Rectal cancer  Before the age of 75 or 2 first degree relative  At any age    I thought your dad had colon cancer  ? What age and any other relatives ?   If average risk we can order this test for you  Good for 3 year   If positive need to go on to colonoscopy.

## 2018-01-10 ENCOUNTER — Encounter: Payer: Self-pay | Admitting: Internal Medicine

## 2018-01-10 ENCOUNTER — Other Ambulatory Visit: Payer: Self-pay | Admitting: Internal Medicine

## 2018-01-10 DIAGNOSIS — Z1231 Encounter for screening mammogram for malignant neoplasm of breast: Secondary | ICD-10-CM

## 2018-01-10 NOTE — Telephone Encounter (Signed)
Will forward to Dr. Regis Bill to make aware of family history.

## 2018-01-10 NOTE — Telephone Encounter (Signed)
The cologuard  Is not supposed to be used for     First degree relative with colon cancer  At this time   Because not tested  So I would feel hesitant to  Order  And interpret this test  In your situation

## 2018-01-12 NOTE — Telephone Encounter (Signed)
Ok     Make sure there is enough time for  Staff to do ear irrigation  And also  Td .   Please Order for yearly FIT test for her to get this done   Thanks. WP

## 2018-01-12 NOTE — Telephone Encounter (Signed)
FYI

## 2018-01-20 ENCOUNTER — Encounter: Payer: Self-pay | Admitting: Internal Medicine

## 2018-01-20 ENCOUNTER — Ambulatory Visit (INDEPENDENT_AMBULATORY_CARE_PROVIDER_SITE_OTHER): Payer: PPO | Admitting: Internal Medicine

## 2018-01-20 VITALS — BP 156/78 | HR 67 | Temp 98.1°F | Wt 137.2 lb

## 2018-01-20 DIAGNOSIS — Z8632 Personal history of gestational diabetes: Secondary | ICD-10-CM

## 2018-01-20 DIAGNOSIS — I10 Essential (primary) hypertension: Secondary | ICD-10-CM | POA: Diagnosis not present

## 2018-01-20 DIAGNOSIS — Z823 Family history of stroke: Secondary | ICD-10-CM

## 2018-01-20 DIAGNOSIS — Z79899 Other long term (current) drug therapy: Secondary | ICD-10-CM

## 2018-01-20 DIAGNOSIS — E785 Hyperlipidemia, unspecified: Secondary | ICD-10-CM | POA: Diagnosis not present

## 2018-01-20 MED ORDER — AMLODIPINE BESYLATE 5 MG PO TABS: 5.0000 mg | ORAL_TABLET | Freq: Every day | ORAL | 3 refills | Status: DC

## 2018-01-20 NOTE — Patient Instructions (Addendum)
Amlodipine 5 mg per day   Dash eating  Keep  March appt  Lab before visit and I will place orders.

## 2018-01-20 NOTE — Progress Notes (Signed)
Chief Complaint  Patient presents with  . Hypertension    Pt having elevated BPs since gaining some weight. Pt wants to discuss BP meds.    HPI: Destiny Cruz 67 y.o.  SDA   Concerns about increase  bp readings up in to the 160s recently with some  Headache   monitor bp off and on and in past  controlled with LSI  But recently some weight gain   . Family hx of ht and stroke    Mom has a f   Father  Died of   cva and had ht.  ROS: See pertinent positives and negatives per HPI. No xp sob  Some palpitations at night  No exercise sx   Or intolerance  Changes   Past Medical History:  Diagnosis Date  . Chicken pox   . Duodenal ulcer    clinical dx when younger responded to med and no recurrance no bleeding  . Gestational diabetes   . Gluten intolerance    possible celiac  see text   . History of scarlatina   . Hx of allergy    as a child hay fever   . Migraines   . UTI (lower urinary tract infection)    once     Family History  Problem Relation Age of Onset  . Colon cancer Father   . Rheum arthritis Mother   . Stroke Father   . Thyroid disease Daughter        partial thyroidectomy   . Celiac disease Son        probable    Social History   Socioeconomic History  . Marital status: Married    Spouse name: None  . Number of children: None  . Years of education: None  . Highest education level: None  Social Needs  . Financial resource strain: None  . Food insecurity - worry: None  . Food insecurity - inability: None  . Transportation needs - medical: None  . Transportation needs - non-medical: None  Occupational History  . Occupation: Retired  Tobacco Use  . Smoking status: Never Smoker  . Smokeless tobacco: Never Used  Substance and Sexual Activity  . Alcohol use: Yes    Alcohol/week: 0.0 oz    Comment: 1-2 Glasses of wine per day  . Drug use: No  . Sexual activity: Yes    Partners: Male  Other Topics Concern  . 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 Cholecalciferol (VITAMIN D-3 PO) Take 4,000 Units by mouth daily.     No facility-administered medications prior to visit.      EXAM:  BP (!) 156/78 (BP Location: Left Arm)   Pulse 67   Temp 98.1 F (36.7 C) (Oral)   Wt 137 lb 3.2 oz (62.2 kg)   BMI 25.92 kg/m   Body mass index is 25.92 kg/m. Repeat BP  158/80 sitting  GENERAL: vitals reviewed and listed above, alert, oriented, appears well hydrated and in no acute distress HEENT: atraumatic, conjunctiva  clear, no obvious abnormalities on inspection of external nose and ears   NECK: no obvious masses on inspection palpation  LUNGS: clear to auscultation bilaterally, no wheezes, rales or rhonchi, good air  movement CV: HRRR, no clubbing cyanosis or  peripheral edema nl cap refill  Nog or m  MS: moves all extremities without noticeable focal  abnormality PSYCH: pleasant and cooperative, no obvious depression or anxiety bp log reviewed    ASSESSMENT AND PLAN:  Discussed the following assessment and plan:  Essential hypertension - Plan: Comprehensive metabolic panel, CBC with Differential/Platelet, Lipid panel, Hemoglobin A1c, TSH  Hyperlipidemia, unspecified hyperlipidemia type - Plan: Comprehensive metabolic panel, CBC with Differential/Platelet, Lipid panel, Hemoglobin A1c, TSH  Hx gestational diabetes - Plan: Comprehensive metabolic panel, CBC with Differential/Platelet, Lipid panel, Hemoglobin A1c, TSH  Medication management - Plan: Comprehensive metabolic panel, CBC with Differential/Platelet, Lipid panel, Hemoglobin A1c, TSH  Family history of cerebrovascular accident (CVA) in father Begin   Medication   And plan fu  Expectant management   ccb or arb    Expectant management. And keep  appt  And plan labs pre visit  -Patient advised to return or notify health care team  if symptoms worsen ,persist or new concerns arise.  Patient Instructions  Amlodipine 5 mg per day   Dash eating  Keep  March appt  Lab before visit and I will place orders.     Standley Brooking. Keziah Drotar M.D.

## 2018-01-23 NOTE — Telephone Encounter (Signed)
It is IFOB  fo stool testing

## 2018-01-23 NOTE — Telephone Encounter (Signed)
Pt notified of need for appt for Td and ear wash.   Please advise Dr Regis Bill on need for FIT testing. I am confused about the request to order yearly "FIT testing "

## 2018-01-24 NOTE — Telephone Encounter (Signed)
Pt contacted . Has chosen to wait for ear wash and Td vaccine until her March 4th appt. Pt advised that this was okay as long as she felt her ears would be able to wait that long. Pt also made aware that we are initiating the Cologuard process and someone will be reaching out to her to set this up.   Form filled out and faxed to [employer] [facility].  Nothing further needed.

## 2018-01-26 ENCOUNTER — Other Ambulatory Visit (INDEPENDENT_AMBULATORY_CARE_PROVIDER_SITE_OTHER): Payer: PPO

## 2018-01-26 ENCOUNTER — Ambulatory Visit
Admission: RE | Admit: 2018-01-26 | Discharge: 2018-01-26 | Disposition: A | Discharge status: Home / Self Care | Payer: PPO | Source: Ambulatory Visit | Attending: Internal Medicine | Admitting: Internal Medicine

## 2018-01-26 DIAGNOSIS — Z1231 Encounter for screening mammogram for malignant neoplasm of breast: Secondary | ICD-10-CM | POA: Diagnosis not present

## 2018-01-26 DIAGNOSIS — E785 Hyperlipidemia, unspecified: Secondary | ICD-10-CM

## 2018-01-26 DIAGNOSIS — Z8632 Personal history of gestational diabetes: Secondary | ICD-10-CM | POA: Diagnosis not present

## 2018-01-26 DIAGNOSIS — I10 Essential (primary) hypertension: Secondary | ICD-10-CM

## 2018-01-26 DIAGNOSIS — Z79899 Other long term (current) drug therapy: Secondary | ICD-10-CM

## 2018-01-26 LAB — LIPID PANEL
Cholesterol: 240 mg/dL — ABNORMAL HIGH (ref 0–200)
HDL: 56.2 mg/dL (ref >39.00)
LDL Cholesterol: 155 mg/dL — ABNORMAL HIGH (ref 0–99)
NonHDL: 184
Total CHOL/HDL Ratio: 4
Triglycerides: 147 mg/dL (ref 0.0–149.0)
VLDL: 29.4 mg/dL (ref 0.0–40.0)

## 2018-01-26 LAB — COMPREHENSIVE METABOLIC PANEL
ALT: 13 U/L (ref 0–35)
AST: 13 U/L (ref 0–37)
Albumin: 4.2 g/dL (ref 3.5–5.2)
Alkaline Phosphatase: 60 U/L (ref 39–117)
BUN: 13 mg/dL (ref 6–23)
CO2: 29 mEq/L (ref 19–32)
Calcium: 9.7 mg/dL (ref 8.4–10.5)
Chloride: 101 mEq/L (ref 96–112)
Creatinine, Ser: 0.76 mg/dL (ref 0.40–1.20)
GFR: 80.35 mL/min (ref >60.00)
Glucose, Bld: 90 mg/dL (ref 70–99)
Potassium: 4.6 mEq/L (ref 3.5–5.1)
Sodium: 137 mEq/L (ref 135–145)
Total Bilirubin: 0.5 mg/dL (ref 0.2–1.2)
Total Protein: 6.7 g/dL (ref 6.0–8.3)

## 2018-01-26 LAB — CBC WITH DIFFERENTIAL/PLATELET
Basophils Absolute: 0.1 10*3/uL (ref 0.0–0.1)
Basophils Relative: 1.3 % (ref 0.0–3.0)
Eosinophils Absolute: 0.3 10*3/uL (ref 0.0–0.7)
Eosinophils Relative: 6 % — ABNORMAL HIGH (ref 0.0–5.0)
HCT: 41.8 % (ref 36.0–46.0)
Hemoglobin: 14.1 g/dL (ref 12.0–15.0)
Lymphocytes Relative: 21.7 % (ref 12.0–46.0)
Lymphs Abs: 1 10*3/uL (ref 0.7–4.0)
MCHC: 33.7 g/dL (ref 30.0–36.0)
MCV: 92.4 fl (ref 78.0–100.0)
Monocytes Absolute: 0.5 10*3/uL (ref 0.1–1.0)
Monocytes Relative: 11.2 % (ref 3.0–12.0)
Neutro Abs: 2.7 10*3/uL (ref 1.4–7.7)
Neutrophils Relative %: 59.8 % (ref 43.0–77.0)
Platelets: 197 10*3/uL (ref 150.0–400.0)
RBC: 4.53 Mil/uL (ref 3.87–5.11)
RDW: 13.1 % (ref 11.5–15.5)
WBC: 4.6 10*3/uL (ref 4.0–10.5)

## 2018-01-26 LAB — TSH: TSH: 3.58 u[IU]/mL (ref 0.35–4.50)

## 2018-01-26 LAB — HEMOGLOBIN A1C: Hgb A1c MFr Bld: 5.2 % (ref 4.6–6.5)

## 2018-01-30 ENCOUNTER — Encounter: Payer: Self-pay | Admitting: Internal Medicine

## 2018-01-30 DIAGNOSIS — I10 Essential (primary) hypertension: Secondary | ICD-10-CM | POA: Insufficient documentation

## 2018-01-30 NOTE — Progress Notes (Signed)
Chief Complaint  Patient presents with  . Bronchitis    low temp, dry cough, cough started thurs,     HPI: Destiny Cruz 67 y.o. come in for concern about bropnchitis   And bp fu   Dry cough in throat   Onset about 5 days ago  And then congestion ur  And low grade temp  And tylenol.   Taken mucinex dm.    codiene cough syrup.  Bid  To tid.    Tylenol daily.   99.8  Yesterday 100.1      Not today no chills shakes  .   No sob  Pain     bp monitoring on med since 2 15  And still elevated at times   Over 160  Some 150   Not taking decongestants   Diet changes    Some help .    ROS: See pertinent positives and negatives per HPI.  Past Medical History:  Diagnosis Date  . Chicken pox   . Duodenal ulcer    clinical dx when younger responded to med and no recurrance no bleeding  . Gestational diabetes   . Gluten intolerance    possible celiac  see text   . History of scarlatina   . Hx of allergy    as a child hay fever   . Migraines   . UTI (lower urinary tract infection)    once     Family History  Problem Relation Age of Onset  . Colon cancer Father   . Stroke Father   . Rheum arthritis Mother   . Thyroid disease Daughter        partial thyroidectomy   . Celiac disease Son        probable    Social History   Socioeconomic History  . Marital status: Married    Spouse name: None  . Number of children: None  . Years of education: None  . Highest education level: None  Social Needs  . Financial resource strain: None  . Food insecurity - worry: None  . Food insecurity - inability: None  . Transportation needs - medical: None  . Transportation needs - non-medical: None  Occupational History  . Occupation: Retired  Tobacco Use  . Smoking status: Never Smoker  . Smokeless tobacco: Never Used  Substance and Sexual Activity  . Alcohol use: Yes    Alcohol/week: 0.0 oz    Comment: 1-2 Glasses of wine per day  . Drug use: No  . Sexual activity: Yes   Partners: Male  Other Topics Concern  . 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 amLODipine (NORVASC) 5 MG tablet Take 1 tablet (5 mg total) by mouth daily. 30 tablet 3  . Cholecalciferol (VITAMIN D-3 PO) Take 4,000 Units by mouth daily.     No facility-administered medications prior to visit.      EXAM:  BP (!) 142/78 (BP Location: Left Arm, Patient Position: Sitting, Cuff Size: Normal)   Pulse 77   Temp 99.8 F (37.7 C) (Oral)   Wt 134 lb 9.6 oz (61.1 kg)   BMI 25.43 kg/m   Body mass index is 25.43 kg/m. WDWN in NAD  quiet  respirations; mildly congested . Non toxic . HEENT: Normocephalic ;atraumatic , Eyes;  PERRL, EOMs  Full, lids and conjunctiva clear,,Ears: no deformities, canals nl, TM landmarks normal, Nose: no deformity or discharge but congested;face non tender Mouth : OP clear without lesion or edema . Neck: Supple without adenopathy or masses or bruits Chest:  Clear to A without wheezes rales or rhonchi CV:  S1-S2 no gallops or murmurs peripheral perfusion is normal Skin :nl perfusion and no acute rashes   Bp log reviewed  Lab Results  Component Value Date   WBC 4.6 01/26/2018   HGB 14.1 01/26/2018   HCT 41.8 01/26/2018   PLT 197.0 01/26/2018   GLUCOSE 90 01/26/2018   CHOL 240 (H) 01/26/2018   TRIG 147.0 01/26/2018   HDL 56.20 01/26/2018   LDLCALC 155 (H) 01/26/2018   ALT 13 01/26/2018   AST 13 01/26/2018   NA 137 01/26/2018   K 4.6 01/26/2018   CL 101 01/26/2018   CREATININE 0.76 01/26/2018   BUN 13 01/26/2018   CO2 29 01/26/2018   TSH 3.58 01/26/2018   HGBA1C 5.2 01/26/2018   BP Readings from Last 3 Encounters:  01/31/18 (!) 142/78  01/20/18 (!) 156/78  03/31/16 (!) 158/80    ASSESSMENT AND PLAN:  Discussed the  following assessment and plan:  Viral upper respiratory tract infection with cough  Essential hypertension  Medication management Rep illness with fever but exam reassuring   If fever  Persists consdier x ray or relapsing sx disc  Comfort measures  Etc  Time .    If needed we disc adding ace arb but  Would not add poss cough producing me at this time     Arb issues also    Can add if not controlling over the next  Week and can discuss again  At her cpx .    -Patient advised to return or notify health care team  if  new concerns arise. Total visit 85mns > 50% spent counseling and coordinating care as indicated in above note and in instructions to patient .   Patient Instructions   I think this is a viral infection   Exam is reassuring.  If fever  Not resolving in the next 48 hours [place]  To r/o occult pneumonia   .    If bp is not below 160   In another week then can add   Arb medication   There are some combo meds but insurance may not want to pay for combo meds   The others are vlasartan(  Contamination problem )  olmesartan   Sometimes more assoc with microscopic colitis  But still a great drug.  telmisartan    Lab Results  Component Value Date   WBC 4.6 01/26/2018   HGB 14.1 01/26/2018   HCT 41.8 01/26/2018   PLT 197.0 01/26/2018   GLUCOSE 90 01/26/2018   CHOL 240 (H) 01/26/2018   TRIG 147.0 01/26/2018   HDL 56.20 01/26/2018   LDLCALC 155 (H) 01/26/2018   ALT 13 01/26/2018   AST 13 01/26/2018   NA 137 01/26/2018   K 4.6 01/26/2018   CL 101 01/26/2018   CREATININE 0.76 01/26/2018   BUN 13 01/26/2018   CO2 29 01/26/2018   TSH 3.58 01/26/2018   HGBA1C 5.2 01/26/2018       WMariann LasterK. Taaj Hurlbut M.D.

## 2018-01-30 NOTE — Telephone Encounter (Signed)
Please advise Dr Panosh, thanks.   

## 2018-01-31 ENCOUNTER — Encounter: Payer: Self-pay | Admitting: Internal Medicine

## 2018-01-31 ENCOUNTER — Ambulatory Visit (INDEPENDENT_AMBULATORY_CARE_PROVIDER_SITE_OTHER): Payer: PPO | Admitting: Internal Medicine

## 2018-01-31 VITALS — BP 142/78 | HR 77 | Temp 99.8°F | Wt 134.6 lb

## 2018-01-31 DIAGNOSIS — Z79899 Other long term (current) drug therapy: Secondary | ICD-10-CM | POA: Diagnosis not present

## 2018-01-31 DIAGNOSIS — J069 Acute upper respiratory infection, unspecified: Secondary | ICD-10-CM

## 2018-01-31 DIAGNOSIS — I10 Essential (primary) hypertension: Secondary | ICD-10-CM

## 2018-01-31 DIAGNOSIS — B9789 Other viral agents as the cause of diseases classified elsewhere: Secondary | ICD-10-CM | POA: Diagnosis not present

## 2018-01-31 MED ORDER — TELMISARTAN 40 MG PO TABS: 40.0000 mg | ORAL_TABLET | Freq: Every day | ORAL | 1 refills | Status: DC

## 2018-01-31 NOTE — Patient Instructions (Addendum)
I think this is a viral infection   Exam is reassuring.  If fever  Not resolving in the next 48 hours contact us for poss cxray  To r/o occult pneumonia   .    If bp is not below 160   In another week then can add   Arb medication   There are some combo meds but insurance may not want to pay for combo meds   The others are vlasartan(  Contamination problem )  olmesartan   Sometimes more assoc with microscopic colitis  But still a great drug.  telmisartan    Lab Results  Component Value Date   WBC 4.6 01/26/2018   HGB 14.1 01/26/2018   HCT 41.8 01/26/2018   PLT 197.0 01/26/2018   GLUCOSE 90 01/26/2018   CHOL 240 (H) 01/26/2018   TRIG 147.0 01/26/2018   HDL 56.20 01/26/2018   LDLCALC 155 (H) 01/26/2018   ALT 13 01/26/2018   AST 13 01/26/2018   NA 137 01/26/2018   K 4.6 01/26/2018   CL 101 01/26/2018   CREATININE 0.76 01/26/2018   BUN 13 01/26/2018   CO2 29 01/26/2018   TSH 3.58 01/26/2018   HGBA1C 5.2 01/26/2018

## 2018-02-03 NOTE — Progress Notes (Signed)
Chief Complaint  Patient presents with  . Annual Exam    BP readings are now in good low range with Amlodipine. Pt is not wanting to fill her Micardis Rx. // Still has cough and low grade temp (99). Dry hacky cough.     HPI:  Destiny Cruz 67 y.o. comes in today for Preventive Medicare exam/ wellness visit .Since last visit.  Doing better  Still has some cough     No sob  Sig fever.    BP seems to be doing better    Now bp at goal 120 range   No se noted . No palpitations   And sob   Prefers not to take arb cause of the multiple recalls if this med works    doesn't want to   Take statin at this time .  Wears clogged r more than left   ? Td update  And cologuard ordered   Health Maintenance  Topic Date Due  . COLONOSCOPY  09/23/1999  . TETANUS/TDAP  11/19/2017  . MAMMOGRAM  01/27/2020  . INFLUENZA VACCINE  Completed  . DEXA SCAN  Completed  . Hepatitis C Screening  Completed  . PNA vac Low Risk Adult  Completed   Health Maintenance Review LIFESTYLE:  Exercise:   Walks and Tobacco/ETS: Alcohol:  Sugar beverages:no Sleep:7-8 Drug use: no HH: 3    Hearing: ok wax right ear   Vision:  No limitations at present . Last eye check UTD  Safety:  Has smoke detector and wears seat belts.  No firearms. No excess sun exposure. Sees dentist regularly.  Falls: no  Memory: Felt to be good  , no concern from her or her family.  Depression: No anhedonia unusual crying or depressive symptoms  Nutrition: Eats well balanced diet; adequate calcium and vitamin D. No swallowing chewing problems.  Injury: no major injuries in the last six months.  Other healthcare providers:  Reviewed today .  Social:  Lives with spouse married.   Preventive parameters: up-to-date  Reviewed   ADLS:   There are no problems or need for assistance  driving, feeding, obtaining food, dressing, toileting and bathing, managing money using phone. She is independent. 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 GI /GU: No adominal pain, vomiting, change in bowel habits. No blood in the stool. No significant GU symptoms. SKIN/HEME: ,no acute skin rashes suspicious lesions or bleeding. No lymphadenopathy, nodules, masses.  NEURO/ PSYCH:  No neurologic signs such as weakness numbness. No depression anxiety. IMM/ Allergy: No unusual infections.  Allergy .   REST of 12 system review negative except as per HPI   Past Medical History:  Diagnosis Date  . Chicken pox   . Duodenal ulcer    clinical dx when younger responded to med and no recurrance no bleeding  . Gestational diabetes   . Gluten intolerance    possible celiac  see text   . History of scarlatina   . Hx of allergy    as a child hay fever   . Migraines   . UTI (lower urinary tract infection)    once     Family History  Problem Relation Age of Onset  . Colon cancer Father   . Stroke Father   . Rheum arthritis Mother   .  Thyroid disease Daughter        partial thyroidectomy   . Celiac disease Son        probable    Social History   Socioeconomic History  . Marital status: Married    Spouse name: None  . Number of children: None  . Years of education: None  . Highest education level: None  Social Needs  . Financial resource strain: None  . Food insecurity - worry: None  . Food insecurity - inability: None  . Transportation needs - medical: None  . Transportation needs - non-medical: None  Occupational History  . Occupation: Retired  Tobacco Use  . Smoking status: Never Smoker  . Smokeless tobacco: Never Used  Substance and Sexual Activity  . Alcohol use: Yes    Alcohol/week: 0.0 oz    Comment: 1-2 Glasses of wine per day  . Drug use: No  . Sexual activity: Yes    Partners: Male  Other Topics Concern  .  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 amLODipine (NORVASC) 5 MG tablet Take 1 tablet (5 mg total) by mouth daily.  . Cholecalciferol (VITAMIN D-3 PO) Take 4,000 Units by mouth daily.  . [DISCONTINUED] telmisartan (MICARDIS) 40 MG tablet Take 1 tablet (40 mg total) by mouth daily. (Patient not taking: Reported on 02/06/2018)   No facility-administered encounter medications on file as of 02/06/2018.     EXAM:  BP 128/80 (BP Location: Right Arm, Patient Position: Sitting, Cuff Size: Normal)   Pulse 66   Temp 99 F (37.2 C) (Oral)   Ht 5' 0.5" (1.537 m)   Wt 136 lb 1.6 oz (61.7 kg)   BMI 26.14 kg/m   Body mass index is 26.14 kg/m.  Physical Exam: Vital signs reviewed [id number] is a well-developed well-nourished alert cooperative   who appears stated age in no acute distress.  Mild hoarse  No stridor some throat clearing  HEENT: normocephalic atraumatic , Eyes: PERRL EOM's full, conjunctiva clear, Nares: paten,t no deformity discharge or tenderness., Ears: no deformity EAC's wax r more than left  After flushing  Tm intact  TMs with normal landmarks. Mouth: clear OP, no lesions, edema.  Moist mucous membranes. Dentition in adequate repair. NECK: supple without masses, thyromegaly or bruits. CHEST/PULM:  Clear to auscultation and percussion breath sounds equal no wheeze , rales or rhonchi. No chest wall deformities or tenderness. CV: PMI is nondisplaced, S1 S2 no gallops, murmurs, rubs. Peripheral pulses are full without delay.No JVD . Breast: normal by inspection . No dimpling, discharge, masses, tenderness or discharge . ABDOMEN: Bowel sounds normal nontender  No guard or rebound, no hepato splenomegal no CVA tenderness.   Extremtities:  No clubbing cyanosis or edema,  no acute joint swelling or redness no focal atrophy NEURO:  Oriented x3, cranial nerves 3-12 appear to be intact, no obvious focal weakness,gait within normal limits no abnormal reflexes or asymmetrical SKIN: No acute rashes normal turgor, color, no bruising or petechiae. PSYCH: Oriented, good eye contact, no obvious depression anxiety, cognition and judgment appear normal. LN: no cervical axillary adenopathy No noted deficits in memory, attention, and speech.   Lab Results  Component Value Date   WBC 4.6 01/26/2018   HGB  14.1 01/26/2018   HCT 41.8 01/26/2018   PLT 197.0 01/26/2018   GLUCOSE 90 01/26/2018   CHOL 240 (H) 01/26/2018   TRIG 147.0 01/26/2018   HDL 56.20 01/26/2018   LDLCALC 155 (H) 01/26/2018   ALT 13 01/26/2018   AST 13 01/26/2018   NA 137 01/26/2018   K 4.6 01/26/2018   CL 101 01/26/2018   CREATININE 0.76 01/26/2018   BUN 13 01/26/2018   CO2 29 01/26/2018   TSH 3.58 01/26/2018   HGBA1C 5.2 01/26/2018   Reviewed  Lab results  ASSESSMENT AND PLAN:  Discussed the following assessment and plan:  Visit for preventive health examination  Medication management  Essential hypertension - controlled now  ok to not take arb if controlled   Hyperlipidemia, unspecified hyperlipidemia type  Viral upper respiratory tract infection with cough - improving   Bilateral impacted cerumen The 10-year ASCVD risk score Mikey Bussing DC Jr., et al., 2013) is: 10.9%   Values used to calculate the score:     Age: 64 years     Sex: Female     Is Non-Hispanic African American: No     Diabetic: No     Tobacco smoker: No     Systolic Blood Pressure: 557 mmHg     Is BP treated: Yes     HDL Cholesterol: 56.2 mg/dL     Total Cholesterol: 240 mg/dL  Patient Care Team: Burnis Medin, MD as PCP - General (Internal Medicine) Clent Jacks, MD as Consulting Physician (Ophthalmology)  Patient Instructions  Glad you are doing better  . Continue amlodipine    For now   may be enough for  control in addition to life style attention.  And dont have to taqke the micardis at this time Consider statin   Coronary artery calcium score .   If all ok then yearly cpx  Exam and meds   The 10-year ASCVD risk score Mikey Bussing DC Jr., et al., 2013) is: 10.9%   Values used to calculate the score:     Age: 71 years     Sex: Female     Is Non-Hispanic African American: No     Diabetic: No     Tobacco smoker: No     Systolic Blood Pressure: 322 mmHg     Is BP treated: Yes     HDL Cholesterol: 56.2 mg/dL     Total Cholesterol: 240 mg/dL   Health Maintenance, Female Adopting a healthy lifestyle and getting preventive care can go a long way to promote health and wellness. Talk with your health care provider about what schedule of regular examinations is right for you. This is a good chance for you to check in with your provider about disease prevention and staying healthy. In between checkups, there are plenty of things you can do on your own. Experts have done a lot of research about which lifestyle changes and preventive measures are most likely to keep you healthy. Ask your health care provider for more information. Weight and diet Eat a healthy diet  Be sure to include plenty of vegetables, fruits, low-fat dairy products, and lean protein.  Do not eat a lot of foods high in solid fats, added sugars, or salt.  Get regular exercise. This is one of the most important things you can do for your health. ? Most adults should exercise for at least 150 minutes each week. The exercise should increase your heart rate and make you sweat (moderate-intensity exercise). ? Most adults  should also do strengthening exercises at least twice a week. This is in addition to the moderate-intensity exercise.  Maintain a healthy weight  Body mass index (BMI) is a measurement that can be used to identify possible weight problems. It estimates body fat based on height and weight. Your health care provider can  help determine your BMI and help you achieve or maintain a healthy weight.  For females 51 years of age and older: ? A BMI below 18.5 is considered underweight. ? A BMI of 18.5 to 24.9 is normal. ? A BMI of 25 to 29.9 is considered overweight. ? A BMI of 30 and above is considered obese.  Watch levels of cholesterol and blood lipids  You should start having your blood tested for lipids and cholesterol at 69 years of age, then have this test every 5 years.  You may need to have your cholesterol levels checked more often if: ? Your lipid or cholesterol levels are high. ? You are older than 69 years of age. ? You are at high risk for heart disease.  Cancer screening Lung Cancer  Lung cancer screening is recommended for adults 23-33 years old who are at high risk for lung cancer because of a history of smoking.  A yearly low-dose CT scan of the lungs is recommended for people who: ? Currently smoke. ? Have quit within the past 15 years. ? Have at least a 30-pack-year history of smoking. A pack year is smoking an average of one pack of cigarettes a day for 1 year.  Yearly screening should continue until it has been 15 years since you quit.  Yearly screening should stop if you develop a health problem that would prevent you from having lung cancer treatment.  Breast Cancer  Practice breast self-awareness. This means understanding how your breasts normally appear and feel.  It also means doing regular breast self-exams. Let your health care provider know about any changes, no matter how small.  If you are in your 20s or 30s, you should have a clinical breast exam (CBE) by a health care provider every 1-3 years as part of a regular health exam.  If you are 40 or older, have a CBE every year. Also consider having a breast X-ray (mammogram) every year.  If you have a family history of breast cancer, talk to your health care provider about genetic screening.  If you are at high risk  for breast cancer, talk to your health care provider about having an MRI and a mammogram every year.  Breast cancer gene (BRCA) assessment is recommended for women who have family members with BRCA-related cancers. BRCA-related cancers include: ? Breast. ? Ovarian. ? Tubal. ? Peritoneal cancers.  Results of the assessment will determine the need for genetic counseling and BRCA1 and BRCA2 testing.  Cervical Cancer Your health care provider may recommend that you be screened regularly for cancer of the pelvic organs (ovaries, uterus, and vagina). This screening involves a pelvic examination, including checking for microscopic changes to the surface of your cervix (Pap test). You may be encouraged to have this screening done every 3 years, beginning at age 1.  For women ages 30-65, health care providers may recommend pelvic exams and Pap testing every 3 years, or they may recommend the Pap and pelvic exam, combined with testing for human papilloma virus (HPV), every 5 years. Some types of HPV increase your risk of cervical cancer. Testing for HPV may also be done on women of any  age with unclear Pap test results.  Other health care providers may not recommend any screening for nonpregnant women who are considered low risk for pelvic cancer and who do not have symptoms. Ask your health care provider if a screening pelvic exam is right for you.  If you have had past treatment for cervical cancer or a condition that could lead to cancer, you need Pap tests and screening for cancer for at least 20 years after your treatment. If Pap tests have been discontinued, your risk factors (such as having a new sexual partner) need to be reassessed to determine if screening should resume. Some women have medical problems that increase the chance of getting cervical cancer. In these cases, your health care provider may recommend more frequent screening and Pap tests.  Colorectal Cancer  This type of cancer can be  detected and often prevented.  Routine colorectal cancer screening usually begins at 69 years of age and continues through 69 years of age.  Your health care provider may recommend screening at an earlier age if you have risk factors for colon cancer.  Your health care provider may also recommend using home test kits to check for hidden blood in the stool.  A small camera at the end of a tube can be used to examine your colon directly (sigmoidoscopy or colonoscopy). This is done to check for the earliest forms of colorectal cancer.  Routine screening usually begins at age 48.  Direct examination of the colon should be repeated every 5-10 years through 69 years of age. However, you may need to be screened more often if early forms of precancerous polyps or small growths are found.  Skin Cancer  Check your skin from head to toe regularly.  Tell your health care provider about any new moles or changes in moles, especially if there is a change in a mole's shape or color.  Also tell your health care provider if you have a mole that is larger than the size of a pencil eraser.  Always use sunscreen. Apply sunscreen liberally and repeatedly throughout the day.  Protect yourself by wearing long sleeves, pants, a wide-brimmed hat, and sunglasses whenever you are outside.  Heart disease, diabetes, and high blood pressure  High blood pressure causes heart disease and increases the risk of stroke. High blood pressure is more likely to develop in: ? People who have blood pressure in the high end of the normal range (130-139/85-89 mm Hg). ? People who are overweight or obese. ? People who are African American.  If you are 52-70 years of age, have your blood pressure checked every 3-5 years. If you are 33 years of age or older, have your blood pressure checked every year. You should have your blood pressure measured twice-once when you are at a hospital or clinic, and once when you are not at a  hospital or clinic. Record the average of the two measurements. To check your blood pressure when you are not at a hospital or clinic, you can use: ? An automated blood pressure machine at a pharmacy. ? A home blood pressure monitor.  If you are between 54 years and 46 years old, ask your health care provider if you should take aspirin to prevent strokes.  Have regular diabetes screenings. This involves taking a blood sample to check your fasting blood sugar level. ? If you are at a normal weight and have a low risk for diabetes, have this test once every three years after  69 years of age. ? If you are overweight and have a high risk for diabetes, consider being tested at a younger age or more often. Preventing infection Hepatitis B  If you have a higher risk for hepatitis B, you should be screened for this virus. You are considered at high risk for hepatitis B if: ? You were born in a country where hepatitis B is common. Ask your health care provider which countries are considered high risk. ? Your parents were born in a high-risk country, and you have not been immunized against hepatitis B (hepatitis B vaccine). ? You have HIV or AIDS. ? You use needles to inject street drugs. ? You live with someone who has hepatitis B. ? You have had sex with someone who has hepatitis B. ? You get hemodialysis treatment. ? You take certain medicines for conditions, including cancer, organ transplantation, and autoimmune conditions.  Hepatitis C  Blood testing is recommended for: ? Everyone born from [birth year] through 1967. ? Anyone with known risk factors for hepatitis C.  Sexually transmitted infections (STIs)  You should be screened for sexually transmitted infections (STIs) including gonorrhea and chlamydia if: ? You are sexually active and are younger than 69 years of age. ? You are older than 69 years of age and your health care provider tells you that you are at risk for this type of  infection. ? Your sexual activity has changed since you were last screened and you are at an increased risk for chlamydia or gonorrhea. Ask your health care provider if you are at risk.  If you do not have HIV, but are at risk, it may be recommended that you take a prescription medicine daily to prevent HIV infection. This is called pre-exposure prophylaxis (PrEP). You are considered at risk if: ? You are sexually active and do not regularly use condoms or know the HIV status of your partner(s). ? You take drugs by injection. ? You are sexually active with a partner who has HIV.  Talk with your health care provider about whether you are at high risk of being infected with HIV. If you choose to begin PrEP, you should first be tested for HIV. You should then be tested every 3 months for as long as you are taking PrEP. Pregnancy  If you are premenopausal and you may become pregnant, ask your health care provider about preconception counseling.  If you may become pregnant, take 400 to 800 micrograms (mcg) of folic acid every day.  If you want to prevent pregnancy, talk to your health care provider about birth control (contraception). Osteoporosis and menopause  Osteoporosis is a disease in which the bones lose minerals and strength with aging. This can result in serious bone fractures. Your risk for osteoporosis can be identified using a bone density scan.  If you are 63 years of age or older, or if you are at risk for osteoporosis and fractures, ask your health care provider if you should be screened.  Ask your health care provider whether you should take a calcium or vitamin D supplement to lower your risk for osteoporosis.  Menopause may have certain physical symptoms and risks.  Hormone replacement therapy may reduce some of these symptoms and risks. Talk to your health care provider about whether hormone replacement therapy is right for you. Follow these instructions at home:  Schedule  regular health, dental, and eye exams.  Stay current with your immunizations.  Do not use any tobacco products including cigarettes,  chewing tobacco, or electronic cigarettes.  If you are pregnant, do not drink alcohol.  If you are breastfeeding, limit how much and how often you drink alcohol.  Limit alcohol intake to no more than 1 drink per day for nonpregnant women. One drink equals 12 ounces of beer, 5 ounces of wine, or 1 ounces of hard liquor.  Do not use street drugs.  Do not share needles.  Ask your health care provider for help if you need support or information about quitting drugs.  Tell your health care provider if you often feel depressed.  Tell your health care provider if you have ever been abused or do not feel safe at home. This information is not intended to replace advice given to you by your health care provider. Make sure you discuss any questions you have with your health care provider. Document Released: 06/07/2011 Document Revised: 04/29/2016 Document Reviewed: 08/26/2015 Elsevier Interactive Patient Education  2018 [place]. Isaia Hassell M.D.

## 2018-02-06 ENCOUNTER — Encounter: Payer: Self-pay | Admitting: Internal Medicine

## 2018-02-06 ENCOUNTER — Ambulatory Visit (INDEPENDENT_AMBULATORY_CARE_PROVIDER_SITE_OTHER): Payer: PPO | Admitting: Internal Medicine

## 2018-02-06 VITALS — BP 128/80 | HR 66 | Temp 99.0°F | Ht 60.5 in | Wt 136.1 lb

## 2018-02-06 DIAGNOSIS — H6123 Impacted cerumen, bilateral: Secondary | ICD-10-CM

## 2018-02-06 DIAGNOSIS — Z79899 Other long term (current) drug therapy: Secondary | ICD-10-CM

## 2018-02-06 DIAGNOSIS — I10 Essential (primary) hypertension: Secondary | ICD-10-CM

## 2018-02-06 DIAGNOSIS — E785 Hyperlipidemia, unspecified: Secondary | ICD-10-CM

## 2018-02-06 DIAGNOSIS — Z0000 Encounter for general adult medical examination without abnormal findings: Secondary | ICD-10-CM

## 2018-02-06 DIAGNOSIS — B9789 Other viral agents as the cause of diseases classified elsewhere: Secondary | ICD-10-CM | POA: Diagnosis not present

## 2018-02-06 DIAGNOSIS — Z0001 Encounter for general adult medical examination with abnormal findings: Secondary | ICD-10-CM

## 2018-02-06 DIAGNOSIS — J069 Acute upper respiratory infection, unspecified: Secondary | ICD-10-CM

## 2018-02-06 NOTE — Patient Instructions (Addendum)
Glad you are doing better  . Continue amlodipine    For now   may be enough for control in addition to life style attention.  And dont have to taqke the micardis at this time Consider statin   Coronary artery calcium score .   If all ok then yearly cpx  Exam and meds   The 10-year ASCVD risk score Mikey Bussing DC Jr., et al., 2013) is: 10.9%   Values used to calculate the score:     Age: 67 years     Sex: Female     Is Non-Hispanic African American: No     Diabetic: No     Tobacco smoker: No     Systolic Blood Pressure: 062 mmHg     Is BP treated: Yes     HDL Cholesterol: 56.2 mg/dL     Total Cholesterol: 240 mg/dL   Health Maintenance, Female Adopting a healthy lifestyle and getting preventive care can go a long way to promote health and wellness. Talk with your health care provider about what schedule of regular examinations is right for you. This is a good chance for you to check in with your provider about disease prevention and staying healthy. In between checkups, there are plenty of things you can do on your own. Experts have done a lot of research about which lifestyle changes and preventive measures are most likely to keep you healthy. Ask your health care provider for more information. Weight and diet Eat a healthy diet  Be sure to include plenty of vegetables, fruits, low-fat dairy products, and lean protein.  Do not eat a lot of foods high in solid fats, added sugars, or salt.  Get regular exercise. This is one of the most important things you can do for your health. ? Most adults should exercise for at least 150 minutes each week. The exercise should increase your heart rate and make you sweat (moderate-intensity exercise). ? Most adults should also do strengthening exercises at least twice a week. This is in addition to the moderate-intensity exercise.  Maintain a healthy weight  Body mass index (BMI) is a measurement that can be used to identify possible weight problems. It  estimates body fat based on height and weight. Your health care provider can help determine your BMI and help you achieve or maintain a healthy weight.  For females 65 years of age and older: ? A BMI below 18.5 is considered underweight. ? A BMI of 18.5 to 24.9 is normal. ? A BMI of 25 to 29.9 is considered overweight. ? A BMI of 30 and above is considered obese.  Watch levels of cholesterol and blood lipids  You should start having your blood tested for lipids and cholesterol at 69 years of age, then have this test every 5 years.  You may need to have your cholesterol levels checked more often if: ? Your lipid or cholesterol levels are high. ? You are older than 68 years of age. ? You are at high risk for heart disease.  Cancer screening Lung Cancer  Lung cancer screening is recommended for adults 34-25 years old who are at high risk for lung cancer because of a history of smoking.  A yearly low-dose CT scan of the lungs is recommended for people who: ? Currently smoke. ? Have quit within the past 15 years. ? Have at least a 30-pack-year history of smoking. A pack year is smoking an average of one pack of cigarettes a day for 1  year.  Yearly screening should continue until it has been 15 years since you quit.  Yearly screening should stop if you develop a health problem that would prevent you from having lung cancer treatment.  Breast Cancer  Practice breast self-awareness. This means understanding how your breasts normally appear and feel.  It also means doing regular breast self-exams. Let your health care provider know about any changes, no matter how small.  If you are in your 20s or 30s, you should have a clinical breast exam (CBE) by a health care provider every 1-3 years as part of a regular health exam.  If you are 25 or older, have a CBE every year. Also consider having a breast X-ray (mammogram) every year.  If you have a family history of breast cancer, talk to  your health care provider about genetic screening.  If you are at high risk for breast cancer, talk to your health care provider about having an MRI and a mammogram every year.  Breast cancer gene (BRCA) assessment is recommended for women who have family members with BRCA-related cancers. BRCA-related cancers include: ? Breast. ? Ovarian. ? Tubal. ? Peritoneal cancers.  Results of the assessment will determine the need for genetic counseling and BRCA1 and BRCA2 testing.  Cervical Cancer Your health care provider may recommend that you be screened regularly for cancer of the pelvic organs (ovaries, uterus, and vagina). This screening involves a pelvic examination, including checking for microscopic changes to the surface of your cervix (Pap test). You may be encouraged to have this screening done every 3 years, beginning at age 61.  For women ages 65-65, health care providers may recommend pelvic exams and Pap testing every 3 years, or they may recommend the Pap and pelvic exam, combined with testing for human papilloma virus (HPV), every 5 years. Some types of HPV increase your risk of cervical cancer. Testing for HPV may also be done on women of any age with unclear Pap test results.  Other health care providers may not recommend any screening for nonpregnant women who are considered low risk for pelvic cancer and who do not have symptoms. Ask your health care provider if a screening pelvic exam is right for you.  If you have had past treatment for cervical cancer or a condition that could lead to cancer, you need Pap tests and screening for cancer for at least 20 years after your treatment. If Pap tests have been discontinued, your risk factors (such as having a new sexual partner) need to be reassessed to determine if screening should resume. Some women have medical problems that increase the chance of getting cervical cancer. In these cases, your health care provider may recommend more  frequent screening and Pap tests.  Colorectal Cancer  This type of cancer can be detected and often prevented.  Routine colorectal cancer screening usually begins at 69 years of age and continues through 69 years of age.  Your health care provider may recommend screening at an earlier age if you have risk factors for colon cancer.  Your health care provider may also recommend using home test kits to check for hidden blood in the stool.  A small camera at the end of a tube can be used to examine your colon directly (sigmoidoscopy or colonoscopy). This is done to check for the earliest forms of colorectal cancer.  Routine screening usually begins at age 33.  Direct examination of the colon should be repeated every 5-10 years through 69 years  of age. However, you may need to be screened more often if early forms of precancerous polyps or small growths are found.  Skin Cancer  Check your skin from head to toe regularly.  Tell your health care provider about any new moles or changes in moles, especially if there is a change in a mole's shape or color.  Also tell your health care provider if you have a mole that is larger than the size of a pencil eraser.  Always use sunscreen. Apply sunscreen liberally and repeatedly throughout the day.  Protect yourself by wearing long sleeves, pants, a wide-brimmed hat, and sunglasses whenever you are outside.  Heart disease, diabetes, and high blood pressure  High blood pressure causes heart disease and increases the risk of stroke. High blood pressure is more likely to develop in: ? People who have blood pressure in the high end of the normal range (130-139/85-89 mm Hg). ? People who are overweight or obese. ? People who are African American.  If you are 26-62 years of age, have your blood pressure checked every 3-5 years. If you are 10 years of age or older, have your blood pressure checked every year. You should have your blood pressure measured  twice-once when you are at a hospital or clinic, and once when you are not at a hospital or clinic. Record the average of the two measurements. To check your blood pressure when you are not at a hospital or clinic, you can use: ? An automated blood pressure machine at a pharmacy. ? A home blood pressure monitor.  If you are between 65 years and 75 years old, ask your health care provider if you should take aspirin to prevent strokes.  Have regular diabetes screenings. This involves taking a blood sample to check your fasting blood sugar level. ? If you are at a normal weight and have a low risk for diabetes, have this test once every three years after 69 years of age. ? If you are overweight and have a high risk for diabetes, consider being tested at a younger age or more often. Preventing infection Hepatitis B  If you have a higher risk for hepatitis B, you should be screened for this virus. You are considered at high risk for hepatitis B if: ? You were born in a country where hepatitis B is common. Ask your health care provider which countries are considered high risk. ? Your parents were born in a high-risk country, and you have not been immunized against hepatitis B (hepatitis B vaccine). ? You have HIV or AIDS. ? You use needles to inject street drugs. ? You live with someone who has hepatitis B. ? You have had sex with someone who has hepatitis B. ? You get hemodialysis treatment. ? You take certain medicines for conditions, including cancer, organ transplantation, and autoimmune conditions.  Hepatitis C  Blood testing is recommended for: ? Everyone born from [birth year] through 1967. ? Anyone with known risk factors for hepatitis C.  Sexually transmitted infections (STIs)  You should be screened for sexually transmitted infections (STIs) including gonorrhea and chlamydia if: ? You are sexually active and are younger than 69 years of age. ? You are older than 69 years of age and your  health care provider tells you that you are at risk for this type of infection. ? Your sexual activity has changed since you were last screened and you are at an increased risk for chlamydia or gonorrhea. Ask your health care  provider if you are at risk.  If you do not have HIV, but are at risk, it may be recommended that you take a prescription medicine daily to prevent HIV infection. This is called pre-exposure prophylaxis (PrEP). You are considered at risk if: ? You are sexually active and do not regularly use condoms or know the HIV status of your partner(s). ? You take drugs by injection. ? You are sexually active with a partner who has HIV.  Talk with your health care provider about whether you are at high risk of being infected with HIV. If you choose to begin PrEP, you should first be tested for HIV. You should then be tested every 3 months for as long as you are taking PrEP. Pregnancy  If you are premenopausal and you may become pregnant, ask your health care provider about preconception counseling.  If you may become pregnant, take 400 to 800 micrograms (mcg) of folic acid every day.  If you want to prevent pregnancy, talk to your health care provider about birth control (contraception). Osteoporosis and menopause  Osteoporosis is a disease in which the bones lose minerals and strength with aging. This can result in serious bone fractures. Your risk for osteoporosis can be identified using a bone density scan.  If you are 61 years of age or older, or if you are at risk for osteoporosis and fractures, ask your health care provider if you should be screened.  Ask your health care provider whether you should take a calcium or vitamin D supplement to lower your risk for osteoporosis.  Menopause may have certain physical symptoms and risks.  Hormone replacement therapy may reduce some of these symptoms and risks. Talk to your health care provider about whether hormone replacement  therapy is right for you. Follow these instructions at home:  Schedule regular health, dental, and eye exams.  Stay current with your immunizations.  Do not use any tobacco products including cigarettes, chewing tobacco, or electronic cigarettes.  If you are pregnant, do not drink alcohol.  If you are breastfeeding, limit how much and how often you drink alcohol.  Limit alcohol intake to no more than 1 drink per day for nonpregnant women. One drink equals 12 ounces of beer, 5 ounces of wine, or 1 ounces of hard liquor.  Do not use street drugs.  Do not share needles.  Ask your health care provider for help if you need support or information about quitting drugs.  Tell your health care provider if you often feel depressed.  Tell your health care provider if you have ever been abused or do not feel safe at home. This information is not intended to replace advice given to you by your health care provider. Make sure you discuss any questions you have with your health care provider. Document Released: 06/07/2011 Document Revised: 04/29/2016 Document Reviewed: 08/26/2015 Elsevier Interactive Patient Education  [employer].

## 2018-02-21 ENCOUNTER — Encounter: Payer: Self-pay | Admitting: Internal Medicine

## 2018-02-21 DIAGNOSIS — Z1212 Encounter for screening for malignant neoplasm of rectum: Secondary | ICD-10-CM | POA: Diagnosis not present

## 2018-02-21 DIAGNOSIS — Z1211 Encounter for screening for malignant neoplasm of colon: Secondary | ICD-10-CM | POA: Diagnosis not present

## 2018-02-21 LAB — COLOGUARD

## 2018-02-27 ENCOUNTER — Encounter: Payer: Self-pay | Admitting: Internal Medicine

## 2018-02-27 ENCOUNTER — Ambulatory Visit (INDEPENDENT_AMBULATORY_CARE_PROVIDER_SITE_OTHER): Payer: PPO | Admitting: Internal Medicine

## 2018-02-27 VITALS — BP 168/78 | HR 50 | Temp 98.3°F | Wt 132.5 lb

## 2018-02-27 DIAGNOSIS — Z79899 Other long term (current) drug therapy: Secondary | ICD-10-CM

## 2018-02-27 DIAGNOSIS — R9431 Abnormal electrocardiogram [ECG] [EKG]: Secondary | ICD-10-CM | POA: Diagnosis not present

## 2018-02-27 DIAGNOSIS — I10 Essential (primary) hypertension: Secondary | ICD-10-CM

## 2018-02-27 DIAGNOSIS — R0989 Other specified symptoms and signs involving the circulatory and respiratory systems: Secondary | ICD-10-CM | POA: Diagnosis not present

## 2018-02-27 DIAGNOSIS — R002 Palpitations: Secondary | ICD-10-CM | POA: Diagnosis not present

## 2018-02-27 MED ORDER — CARVEDILOL 12.5 MG PO TABS: 6.2500 mg | ORAL_TABLET | Freq: Two times a day (BID) | ORAL | 1 refills | Status: DC

## 2018-02-27 NOTE — Patient Instructions (Addendum)
  Will arrange   Send in referral to [facility]   Dr Joylene Grapes consider  echocardiogram   To check LV function. ( assume normal ) .   Add medication adding    Carvedilol or similar medciation  Such as  Metoprolol as these can   Slow heart rate and  Used for palpiations sometimes    Contact us   In 2 week s about BP  Readings    Otherwise    1 mos OV.

## 2018-02-27 NOTE — Progress Notes (Signed)
Chief Complaint  Patient presents with  . Hypertension    Pt states that her BP and HR have been increased x1 week . Having episodes where she flushes, heart racing, anxious feeling. Pt states that when these episodes happen she is sitting still and resting, not active.     HPI:  Destiny Cruz 67 y.o. come in for HT   episodes    Flushed and then   Pounding heart  And no hx of panic attack  No rhyme or reason  Issues.    bp in past week  160- 170 ocass 190 range    See above .   Not triggered by anxiety although now she is anxoious    Worried about getting a stroke.      mom has a fib.  Se doesn't think it is a fib.     exercise no triggered.  And no change in exercise  Problem   May have had some of these sx  Before put ion amlodipine.   Didn't sleep cause of concern  Ok at bed and has cut out caffiene  And etoh rechetly .   Take bp med in am .  ROS: See pertinent positives and negatives per HPI. No syncope fever   New meds    Past Medical History:  Diagnosis Date  . Chicken pox   . Duodenal ulcer    clinical dx when younger responded to med and no recurrance no bleeding  . Gestational diabetes   . Gluten intolerance    possible celiac  see text   . History of scarlatina   . Hx of allergy    as a child hay fever   . Migraines   . UTI (lower urinary tract infection)    once     Family History  Problem Relation Age of Onset  . Colon cancer Father   . Stroke Father   . Rheum arthritis Mother   . Thyroid disease Daughter        partial thyroidectomy   . Celiac disease Son        probable    Social History   Socioeconomic History  . Marital status: Married    Spouse name: Not on file  . Number of children: Not on file  . Years of education: Not on file  . Highest education level: Not on file  Occupational History  . Occupation: Retired  Scientific laboratory technician  . Financial resource strain: Not on file  . Food insecurity:    Worry: Not on file    Inability:  Not on file  . Transportation needs:    Medical: Not on file    Non-medical: Not on file  Tobacco Use  . Smoking status: Never Smoker  . Smokeless tobacco: Never Used  Substance and Sexual Activity  . Alcohol use: Yes    Alcohol/week: 0.0 oz    Comment: 1-2 Glasses of wine per day  . Drug use: No  . Sexual activity: Yes    Partners: Male  Lifestyle  . Physical activity:    Days per week: Not on file    Minutes per session: Not on file  . Stress: Not on file  Relationships  . Social connections:    Talks on phone: Not on file    Gets together: Not on file    Attends religious service: Not on file    Active member of club or organization: Not on file    Attends  meetings of clubs or organizations: Not on file    Relationship status: Not on file  Other Topics Concern  . 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 amLODipine (NORVASC) 5 MG tablet Take 1 tablet (5 mg total) by mouth daily. 30 tablet 3  . Cholecalciferol (VITAMIN D-3 PO) Take 4,000 Units by mouth daily.     No facility-administered medications prior to visit.      EXAM:  BP (!) 168/78 (BP Location: Right Arm, Patient Position: Sitting, Cuff Size: Normal)   Pulse (!) 50   Temp 98.3 F (36.8 C) (Oral)   Wt 132 lb 8 oz (60.1 kg)   BMI 25.45 kg/m   Body mass index is 25.45 kg/m.  GENERAL: vitals reviewed and listed above, alert, oriented, appears well hydrated and in no acute distress mildly anxious   HEENT: atraumatic, conjunctiva  clear, no obvious abnormalities on inspection of external nose and ears  NECK: no obvious masses on inspection palpation  LUNGS: clear to auscultation bilaterally, no wheezes, rales or rhonchi, good air movement CV: HR  ocass premature beat   Nog  or m , no clubbing cyanosis or  peripheral edema nl cap refill  MS: moves all extremities without noticeable focal  abnormality PSYCH: pleasant and cooperative, mildly anxious  anxiety Lab Results  Component Value Date   WBC 4.6 01/26/2018   HGB 14.1 01/26/2018   HCT 41.8 01/26/2018   PLT 197.0 01/26/2018   GLUCOSE 90 01/26/2018   CHOL 240 (H) 01/26/2018   TRIG 147.0 01/26/2018   HDL 56.20 01/26/2018   LDLCALC 155 (H) 01/26/2018   ALT 13 01/26/2018   AST 13 01/26/2018   NA 137 01/26/2018   K 4.6 01/26/2018   CL 101 01/26/2018   CREATININE 0.76 01/26/2018   BUN 13 01/26/2018   CO2 29 01/26/2018   TSH 3.58 01/26/2018   HGBA1C 5.2 01/26/2018   BP Readings from Last 3 Encounters:  02/27/18 (!) 168/78  02/06/18 128/80  01/31/18 (!) 142/78   EKG   Lad   Episode of palpitation during ekg  Note  Short run of ? Svt rate about 150 and then pause and pvc and then sinus rate of 80 range   Log  bp in past week 170 and up rate around 90  ASSESSMENT AND PLAN:  Discussed the following assessment and plan:  Labile hypertension - Plan: EKG 12-Lead, Ambulatory referral to Cardiology  Essential hypertension - Plan: EKG 12-Lead  Medication management  Palpitations - Plan: EKG 12-Lead, Ambulatory referral to Cardiology  Abnormal electrocardiogram (ECG) (EKG) - Plan: Ambulatory referral to Cardiology Mom had paf     For today add  carvedilol  because of the  Sx complex  And options discussed adding acei etc    Plan  Referral requested dr AL red  Who sees her mom  Also  -Patient advised to return or notify health care team  if  new concerns arise.  Patient Instructions   Will arrange   Send in referral to [facility]   Dr Joylene Grapes consider  echocardiogram   To check LV function. ( assume normal ) .   Add medication adding    Carvedilol or similar medciation  Such as  Metoprolol as these can   Slow heart rate and  Used for palpiations sometimes    Contact us   In 2 week s about BP  Readings     Otherwise    1 mos OV.     Standley Brooking. Panosh M.D.

## 2018-02-28 NOTE — Telephone Encounter (Signed)
See two e-mails from today ( one this morning and another this afternoon)

## 2018-03-01 ENCOUNTER — Encounter: Payer: Self-pay | Admitting: Internal Medicine

## 2018-03-01 ENCOUNTER — Telehealth: Payer: Self-pay | Admitting: Family Medicine

## 2018-03-01 DIAGNOSIS — R195 Other fecal abnormalities: Secondary | ICD-10-CM

## 2018-03-01 NOTE — Telephone Encounter (Signed)
Advise  colonscopy after cardiology evaluation and bp stabilization.

## 2018-03-01 NOTE — Telephone Encounter (Signed)
Copied from [place]. Topic: Inquiry >> Mar 01, 2018 10:41 AM Ether Griffins B wrote: Reason for CRM: Scott with [facility] wanting to verify if the office received an abdnormal colo guard results on pt sent over around 8:30 on 3/26. Call back number [phone]. Order # [id number].

## 2018-03-01 NOTE — Telephone Encounter (Signed)
[facility] aware that we received the POSITIVE cologuard results.   Per Dr Regis Bill, contact patient to make aware of results.  Advise colonoscopy as per protocol   Pt states that with [facility] having such a high false positive rate, she does not really want to do anything at this time.   Will send to Dr Regis Bill as Juluis Rainier, pt aware.

## 2018-03-01 NOTE — Telephone Encounter (Signed)
Pt advised. Nothing further needed.

## 2018-03-02 NOTE — Telephone Encounter (Signed)
See my previous response   I think you should talk with a gastroenterologist ( even though they have a interest in procedures )

## 2018-03-02 NOTE — Telephone Encounter (Signed)
Would not know the fee   fo virtual  colonoscopy but  Not indicated for  Diagnosis ( as opposed to screening)   Still have to do prep.  And if abnormal  Have to do colonoscopy and can miss small polyps . ( ie false negative )   You   Can call [place] [facility] and see what the cost is .   Does she want to talk with GI more thoroughly about this situation ?    BPcontrol   Just to be reasonable  Before a procedure  .  Not a high surgical risk procedure .  And not a barrier  To getting test done

## 2018-03-02 NOTE — Telephone Encounter (Signed)
Please advise Dr Panosh, thanks.   

## 2018-03-03 NOTE — Telephone Encounter (Signed)
Will send to Dr Regis Bill as Juluis Rainier

## 2018-03-03 NOTE — Telephone Encounter (Signed)
I think dr Delane Ginger does   Colons  .  Also  if bp 160 range continues  You can increase dose   After a week   Of med

## 2018-03-06 NOTE — Telephone Encounter (Signed)
Please advise Dr Panosh, thanks.   

## 2018-03-08 NOTE — Telephone Encounter (Signed)
Just continue to follow    If pulse 45 and below   Repeatedly and feel bad with this then let us know

## 2018-03-09 NOTE — Telephone Encounter (Signed)
Will send to Dr Regis Bill as Juluis Rainier

## 2018-03-10 NOTE — Telephone Encounter (Signed)
knee pain shouldn't be related   Stop taking your readings for 3 days      To avoid the stress response  .

## 2018-03-10 NOTE — Telephone Encounter (Signed)
PLease see patient email from this morning - BP ranges and new knee pain.   Please advise Dr Regis Bill, thanks.

## 2018-03-10 NOTE — Telephone Encounter (Signed)
reviewed

## 2018-03-16 ENCOUNTER — Encounter: Payer: Self-pay | Admitting: Internal Medicine

## 2018-03-16 NOTE — Telephone Encounter (Signed)
immunization was added to the Patients chart.

## 2018-03-24 ENCOUNTER — Encounter: Payer: Self-pay | Admitting: Cardiovascular Disease

## 2018-03-28 NOTE — Progress Notes (Signed)
Chief Complaint  Patient presents with  . Follow-up    av. bp around 144/82, toleration medication well, has noticed a change in BP since starting medication .    HPI: Destiny Cruz 67 y.o. come in for Chronic disease management   bp and palpitations   No se of med 12.5 carv bid  And amlod 5  Has log  140- 150 range piulses  In the 60 range  Has upcoming appt  Card Nahser    May 1   Walking   6 - 10 steps  per day  And  Exercise tolerance ok thinks   doing better .      No  Aggravation of  Premature beats    somtimes  Occurs after eating  . Avoiding excess caffiene and etoh .   Sleep 7- 8 hours   hsa pos  cologuard and will plan on fu after  cv issues  Controlled  ROS: See pertinent positives and negatives per HPI.  Past Medical History:  Diagnosis Date  . Chicken pox   . Duodenal ulcer    clinical dx when younger responded to med and no recurrance no bleeding  . Gestational diabetes   . Gluten intolerance    possible celiac  see text   . History of scarlatina   . Hx of allergy    as a child hay fever   . Migraines   . UTI (lower urinary tract infection)    once     Family History  Problem Relation Age of Onset  . Colon cancer Father   . Stroke Father   . Rheum arthritis Mother   . Thyroid disease Daughter        partial thyroidectomy   . Celiac disease Son        probable    Social History   Socioeconomic History  . Marital status: Married    Spouse name: Not on file  . Number of children: Not on file  . Years of education: Not on file  . Highest education level: Not on file  Occupational History  . Occupation: Retired  Scientific laboratory technician  . Financial resource strain: Not on file  . Food insecurity:    Worry: Not on file    Inability: Not on file  . Transportation needs:    Medical: Not on file    Non-medical: Not on file  Tobacco Use  . Smoking status: Never Smoker  . Smokeless tobacco: Never Used  Substance and Sexual Activity  . Alcohol  use: Yes    Alcohol/week: 0.0 oz    Comment: 1-2 Glasses of wine per day  . Drug use: No  . Sexual activity: Yes    Partners: Male  Lifestyle  . Physical activity:    Days per week: Not on file    Minutes per session: Not on file  . Stress: Not on file  Relationships  . Social connections:    Talks on phone: Not on file    Gets together: Not on file    Attends religious service: Not on file    Active member of club or organization: Not on file    Attends meetings of clubs or organizations: Not on file    Relationship status: Not on file  Other Topics Concern  . 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 carvedilol (COREG) 12.5 MG tablet Take 0.5 tablets (6.25 mg total) by mouth 2 (two) times daily with a meal. Increase to 12.5 po bid as directed. 60 tablet 1  . Cholecalciferol (VITAMIN D-3 PO) Take 4,000 Units by mouth daily.    Marland Kitchen amLODipine (NORVASC) 5 MG tablet Take 1 tablet (5 mg total) by mouth daily. 30 tablet 3   No facility-administered medications prior to visit.      EXAM:  BP (!) 146/82 (BP Location: Left Arm, Patient Position: Sitting, Cuff Size: Normal)   Pulse 60   Temp 98.2 F (36.8 C) (Oral)   Wt 129 lb 9.6 oz (58.8 kg)   BMI 24.89 kg/m   Body mass index is 24.89 kg/m.  GENERAL: vitals reviewed and listed above, alert, oriented, appears well hydrated and in no acute distress HEENT: atraumatic, conjunctiva  clear, no obvious abnormalities on inspection of external nose and ears NECK: no obvious masses on inspection palpation  LUNGS: clear to auscultation bilaterally, no wheezes, rales or rhonchi, good air movement CV: HRRR, no clubbing cyanosis or  peripheral edema nl cap refill  MS: moves all extremities without  noticeable focal  abnormality PSYCH: pleasant and cooperative, no obvious depression or anxiety  BP Readings from Last 3 Encounters:  03/30/18 (!) 146/82  02/27/18 (!) 168/78  02/06/18 128/80    ASSESSMENT AND PLAN:  Discussed the following assessment and plan:  Labile hypertension  Medication management  Palpitations  Hx gestational diabetes  Positive colorectal cancer screening using Cologuard test  Family history of atrial fibrillation - mom Improved   And palpitations  Improved     options discussed  Cont lsi   And will ty inc  amlod 10 per day at this time .  She will plan on gi eval colon  Female preferred  . Let us know  No sx  fam hx of colon cancer .   -Patient advised to return or notify health care team  if  new concerns arise.  Patient Instructions  Increase  Amlodipine to 10 mg per day    Stay on the carvedilol    12.5 bid ( option to increase  At some point if needed  Max dose 25  Twice a day )   Keep cards appt    Plan ROV  3 months  Or as needed .   Dr Collene Mares and  Burna Mortimer .   GI          Standley Brooking. Malic Rosten M.D.

## 2018-03-30 ENCOUNTER — Encounter: Payer: Self-pay | Admitting: Internal Medicine

## 2018-03-30 ENCOUNTER — Ambulatory Visit (INDEPENDENT_AMBULATORY_CARE_PROVIDER_SITE_OTHER): Payer: PPO | Admitting: Internal Medicine

## 2018-03-30 VITALS — BP 146/82 | HR 60 | Temp 98.2°F | Wt 129.6 lb

## 2018-03-30 DIAGNOSIS — Z79899 Other long term (current) drug therapy: Secondary | ICD-10-CM

## 2018-03-30 DIAGNOSIS — Z8632 Personal history of gestational diabetes: Secondary | ICD-10-CM | POA: Diagnosis not present

## 2018-03-30 DIAGNOSIS — R195 Other fecal abnormalities: Secondary | ICD-10-CM

## 2018-03-30 DIAGNOSIS — Z8249 Family history of ischemic heart disease and other diseases of the circulatory system: Secondary | ICD-10-CM

## 2018-03-30 DIAGNOSIS — R0989 Other specified symptoms and signs involving the circulatory and respiratory systems: Secondary | ICD-10-CM

## 2018-03-30 DIAGNOSIS — R002 Palpitations: Secondary | ICD-10-CM

## 2018-03-30 MED ORDER — AMLODIPINE BESYLATE 10 MG PO TABS: 10.0000 mg | ORAL_TABLET | Freq: Every day | ORAL | 1 refills | Status: DC

## 2018-03-30 NOTE — Patient Instructions (Addendum)
Increase  Amlodipine to 10 mg per day    Stay on the carvedilol    12.5 bid ( option to increase  At some point if needed  Max dose 25  Twice a day )   Keep cards appt    Plan ROV  3 months  Or as needed .   Dr Collene Mares and  Burna Mortimer .   GI

## 2018-04-05 ENCOUNTER — Telehealth: Payer: Self-pay | Admitting: *Deleted

## 2018-04-05 ENCOUNTER — Encounter: Payer: Self-pay | Admitting: Cardiovascular Disease

## 2018-04-05 ENCOUNTER — Ambulatory Visit: Payer: PPO | Admitting: Cardiovascular Disease

## 2018-04-05 VITALS — BP 152/74 | HR 57 | Ht 60.6 in | Wt 128.1 lb

## 2018-04-05 DIAGNOSIS — I10 Essential (primary) hypertension: Secondary | ICD-10-CM | POA: Diagnosis not present

## 2018-04-05 DIAGNOSIS — I493 Ventricular premature depolarization: Secondary | ICD-10-CM | POA: Diagnosis not present

## 2018-04-05 MED ORDER — HYDROCHLOROTHIAZIDE 25 MG PO TABS: 12.5000 mg | ORAL_TABLET | Freq: Every day | ORAL | 3 refills | Status: DC

## 2018-04-05 MED ORDER — POTASSIUM CHLORIDE ER 10 MEQ PO TBCR: 10.0000 meq | EXTENDED_RELEASE_TABLET | Freq: Every day | ORAL | 3 refills | Status: DC

## 2018-04-05 NOTE — Telephone Encounter (Signed)
[place] left a msg on the refill vm wanting to be sure okay to dispense hctz as patient has a sulfa allergy. They discussed with the patient and she would like Dr Elmarie Shiley advise. Please call [phone].

## 2018-04-05 NOTE — Progress Notes (Signed)
Cardiology Office Note:    Date:  04/05/2018   ID:  Nicholes Calamity, DOB [birth year], MRN [id number]  PCP:  Burnis Medin, MD  Cardiologist:  No primary care provider on file.   Referring MD: Burnis Medin, MD   Chief Complaint  Patient presents with  . Hypertension    Apr 05, 2018   Destiny Cruz is a 67 y.o. female with a hx of HTN for years  Much higher this past year .  Amlodipine 5 mg a day , then increased to 10 mg a day  Has been having PVC s - Coreg 12.5  BID was added  She brought her blood pressure log with her.  A lot of her readings over the past month have been in the 1 50-1 60 range.  With these recent increasing medication doses her blood pressures typically in the 1 30-1 40 range.  Occasionally her blood pressure is well controlled but she still has occasional systolic blood pressures of 160.   No severely blood elevated blood pressure since doubling the amlodipine last week.  Does not eat salt  Has been losing weight on her diet .   She walks 30 minuets a day ,   Is a retired Licensed conveyancer   Father died of a hemorhagic stroke , mother had afib.  No CP , no dyspnea.     Past Medical History:  Diagnosis Date  . Chicken pox   . Duodenal ulcer    clinical dx when younger responded to med and no recurrance no bleeding  . Gestational diabetes   . Gluten intolerance    possible celiac  see text   . History of scarlatina   . Hx of allergy    as a child hay fever   . Migraines   . UTI (lower urinary tract infection)    once     Past Surgical History:  Procedure Laterality Date  . CESAREAN SECTION  [id number]    Current Medications: Current Meds  Medication Sig  . amLODipine (NORVASC) 10 MG tablet Take 1 tablet (10 mg total) by mouth daily.  . carvedilol (COREG) 12.5 MG tablet Take 0.5 tablets (6.25 mg total) by mouth 2 (two) times daily with a meal. Increase to 12.5 po bid as directed.  . Cholecalciferol (VITAMIN D-3 PO) Take 4,000 Units  by mouth daily.     Allergies:   Keflex [cephalexin]; Penicillins; and Sulfa antibiotics   Social History   Socioeconomic History  . Marital status: Married    Spouse name: Not on file  . Number of children: Not on file  . Years of education: Not on file  . Highest education level: Not on file  Occupational History  . Occupation: Retired  Scientific laboratory technician  . Financial resource strain: Not on file  . Food insecurity:    Worry: Not on file    Inability: Not on file  . Transportation needs:    Medical: Not on file    Non-medical: Not on file  Tobacco Use  . Smoking status: Never Smoker  . Smokeless tobacco: Never Used  Substance and Sexual Activity  . Alcohol use: Yes    Alcohol/week: 0.0 oz    Comment: 1-2 Glasses of wine per day  . Drug use: No  . Sexual activity: Yes    Partners: Male  Lifestyle  . Physical activity:    Days per week: Not on file    Minutes per session: Not on  file  . Stress: Not on file  Relationships  . Social connections:    Talks on phone: Not on file    Gets together: Not on file    Attends religious service: Not on file    Active member of club or organization: Not on file    Attends meetings of clubs or organizations: Not on file    Relationship status: Not on file  Other Topics Concern  . 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  ROS:   Please see the history of present illness.       EKGs/Labs/Other Studies Reviewed:       EKG:   -Apr 05, 2018:    Sinus bradycardia 55 beats minute.  She has U waves.  Recent Labs: 01/26/2018: ALT 13; BUN 13; Creatinine, Ser 0.76; Hemoglobin 14.1;  Platelets 197.0; Potassium 4.6; Sodium 137; TSH 3.58  Recent Lipid Panel    Component Value Date/Time   CHOL 240 (H) 01/26/2018 0750   TRIG 147.0 01/26/2018 0750   HDL 56.20 01/26/2018 0750   CHOLHDL 4 01/26/2018 0750   VLDL 29.4 01/26/2018 0750   LDLCALC 155 (H) 01/26/2018 0750    Physical Exam:    VS:  BP (!) 152/74   Pulse (!) 57   Ht 5' 5"  (1.651 m)   Wt 128 lb 1.3 oz (58.1 kg)   SpO2 97%   BMI 21.31 kg/m     Wt Readings from Last 3 Encounters:  04/05/18 128 lb 1.3 oz (58.1 kg)  03/30/18 129 lb 9.6 oz (58.8 kg)  02/27/18 132 lb 8 oz (60.1 kg)     GEN:  Well nourished, well developed in no acute distress HEENT: Normal NECK: No JVD; No carotid bruits LYMPHATICS: No lymphadenopathy CARDIAC:   RR , occasional PVCs  RESPIRATORY:  Clear to auscultation without rales, wheezing or rhonchi  ABDOMEN: Soft, non-tender, non-distended MUSCULOSKELETAL:  No edema; No deformity  SKIN: Warm and dry NEUROLOGIC:  Alert and oriented x 3 PSYCHIATRIC:  Normal affect   ASSESSMENT:    1. Benign essential HTN    PLAN:    In order of problems listed above:  1. HTN - add HCTZ   12.5 mg a day .  Kdur 10 meq a day  BMP in 3 weeks   2. PVCs -benign and stable.   Medication Adjustments/Labs and Tests Ordered: Current medicines are reviewed at length with the patient today.  Concerns regarding medicines are outlined above.  No orders of the defined types were placed in this encounter.  No orders of the defined types were placed in this encounter.   There are no Patient Instructions on file for this visit.   Signed, Mertie Moores, MD  04/05/2018 10:43 AM    [place]

## 2018-04-05 NOTE — Patient Instructions (Addendum)
Medication Instructions:  Your physician has recommended you make the following change in your medication:   START HCTZ (hydrochlorothiazide) 12.5 mg (1/2 tab) daily START Kdur (potassium chloride) 10 mEq once daily   Labwork: TODAY - magnesium, basic metabolic panel   Testing/Procedures: None Ordered   Follow-Up: Your physician recommends that you return for a BP check and lab appointment on Tuesday May 21 at 2:00 pm   Your physician recommends that you schedule a follow-up appointment in: 3 months with Dr. Acie Fredrickson   If you need a refill on your cardiac medications before your next appointment, please call your pharmacy.   Thank you for choosing [facility]! Christen Bame, RN [phone]

## 2018-04-06 LAB — MAGNESIUM: Magnesium: 2.1 mg/dL (ref 1.6–2.3)

## 2018-04-06 LAB — BASIC METABOLIC PANEL
BUN/Creatinine Ratio: 13 (ref 12–28)
BUN: 11 mg/dL (ref 8–27)
CO2: 24 mmol/L (ref 20–29)
Calcium: 10 mg/dL (ref 8.7–10.3)
Chloride: 103 mmol/L (ref 96–106)
Creatinine, Ser: 0.85 mg/dL (ref 0.57–1.00)
GFR calc Af Amer: 81 mL/min/{1.73_m2} (ref >59)
GFR calc non Af Amer: 71 mL/min/{1.73_m2} (ref >59)
Glucose: 97 mg/dL (ref 65–99)
Potassium: 4.3 mmol/L (ref 3.5–5.2)
Sodium: 141 mmol/L (ref 134–144)

## 2018-04-06 NOTE — Telephone Encounter (Signed)
[place] and spoke with Cecille Rubin who verified that there is a low rate of interaction between patients with sulfa allergy and HCTZ. I advised her that Dr. Acie Fredrickson would like for patient to try the medication and notify us with issues. She states the patient has picked up the HCTZ.   Called patient and talked with her about taking the HCTZ with her history of sulfa allergy. She verbalized understanding and agreement to continue with current treatment and will call back if she has any questions or concerns while taking the HCTZ. She thanked me for the call.

## 2018-04-07 ENCOUNTER — Encounter: Payer: Self-pay | Admitting: Cardiovascular Disease

## 2018-04-08 ENCOUNTER — Encounter: Payer: Self-pay | Admitting: Internal Medicine

## 2018-04-09 ENCOUNTER — Encounter: Payer: Self-pay | Admitting: Internal Medicine

## 2018-04-09 ENCOUNTER — Encounter: Payer: Self-pay | Admitting: Cardiovascular Disease

## 2018-04-10 ENCOUNTER — Other Ambulatory Visit: Payer: Self-pay | Admitting: Nurse Practitioner

## 2018-04-10 ENCOUNTER — Encounter: Payer: Self-pay | Admitting: Internal Medicine

## 2018-04-10 MED ORDER — HYDROCHLOROTHIAZIDE 25 MG PO TABS: 25.0000 mg | ORAL_TABLET | Freq: Every day | ORAL | 11 refills | Status: DC

## 2018-04-10 MED ORDER — AMLODIPINE BESYLATE 5 MG PO TABS: 5.0000 mg | ORAL_TABLET | Freq: Every day | ORAL | 11 refills | Status: DC

## 2018-04-10 NOTE — Telephone Encounter (Signed)
FYI Dr Regis Bill

## 2018-04-10 NOTE — Telephone Encounter (Signed)
I agree with dr Lanny Hurst plan   Decrease the amlodipine to 5 mg  And  His addition of low dose  Med may be enough   Monitor  Over next 1-2 months

## 2018-04-11 NOTE — Telephone Encounter (Signed)
Will send to Dr Regis Bill as Juluis Rainier

## 2018-04-18 ENCOUNTER — Other Ambulatory Visit: Payer: Self-pay | Admitting: Nurse Practitioner

## 2018-04-18 ENCOUNTER — Encounter: Payer: Self-pay | Admitting: Cardiovascular Disease

## 2018-04-18 MED ORDER — CARVEDILOL 6.25 MG PO TABS: 6.2500 mg | ORAL_TABLET | Freq: Two times a day (BID) | ORAL | 11 refills | Status: DC

## 2018-04-18 NOTE — Telephone Encounter (Signed)
FYI Dr Regis Bill

## 2018-04-18 NOTE — Telephone Encounter (Signed)
Will send back to Dr Regis Bill as Juluis Rainier

## 2018-04-18 NOTE — Telephone Encounter (Signed)
Noted  

## 2018-04-25 ENCOUNTER — Encounter: Payer: Self-pay | Admitting: Internal Medicine

## 2018-04-25 ENCOUNTER — Ambulatory Visit (INDEPENDENT_AMBULATORY_CARE_PROVIDER_SITE_OTHER): Payer: PPO | Admitting: Nurse Practitioner

## 2018-04-25 ENCOUNTER — Other Ambulatory Visit: Payer: PPO

## 2018-04-25 VITALS — BP 156/80 | HR 60 | Ht 60.0 in | Wt 128.2 lb

## 2018-04-25 DIAGNOSIS — I10 Essential (primary) hypertension: Secondary | ICD-10-CM

## 2018-04-25 DIAGNOSIS — I493 Ventricular premature depolarization: Secondary | ICD-10-CM

## 2018-04-25 MED ORDER — CARVEDILOL 6.25 MG PO TABS: 6.2500 mg | ORAL_TABLET | Freq: Two times a day (BID) | ORAL | 3 refills | Status: DC

## 2018-04-25 MED ORDER — LOSARTAN POTASSIUM 50 MG PO TABS: 50.0000 mg | ORAL_TABLET | Freq: Every day | ORAL | 11 refills | Status: DC

## 2018-04-25 NOTE — Progress Notes (Signed)
1.) Reason for visit: BP check and BMET for medication changes from ov on 5/1  2.) Name of MD requesting visit: Dr. Acie Fredrickson  3.) H&P: Hx HTN, patient referred from Dr. Regis Bill for BP management after starting Amlodipine 10 mg QD and Carvedilol 12.5 mg BID. Dr. Acie Fredrickson advised patient to start HCTZ 12.5 mg QD and KDur 10 mEq by on 5/1  4.) ROS related to problem: shortly after initiation of medications, patient called with low HR and bilateral ankle edema  Amlodipine was reduced to 5 mg QD and Carvedilol was  reduced to 6.25 mg BID  HCTZ was increased to 25 mg QD  5.) Assessment and plan per MD: patient present with BP log that shows systolic BP 836'D to 255'Q mmHg and 01'U diastolic; BP today is 429/03 mmHg  Per Dr. Acie Fredrickson, have patient stop Amlodipine and start  Losartan 50 mg QD. He would like her to return for nurse  visit and BMET in 3-4 weeks

## 2018-04-25 NOTE — Patient Instructions (Signed)
Medication Instructions:  Your physician has recommended you make the following change in your medication:   STOP Amlodipine START Losartan 50 mg once daily    Labwork: DONE TODAY - I will call you with the results and advise you on your potassium dose   Testing/Procedures: None Ordered   Follow-Up: Your physician recommends that you return for a follow-up appointment on Tuesday June 18 at 2:00 pm for Nurse visit/BP Check   If you need a refill on your cardiac medications before your next appointment, please call your pharmacy.   Thank you for choosing [facility]! Christen Bame, RN [phone]

## 2018-04-26 LAB — BASIC METABOLIC PANEL
BUN/Creatinine Ratio: 11 — ABNORMAL LOW (ref 12–28)
BUN: 13 mg/dL (ref 8–27)
CO2: 25 mmol/L (ref 20–29)
Calcium: 9.7 mg/dL (ref 8.7–10.3)
Chloride: 99 mmol/L (ref 96–106)
Creatinine, Ser: 1.14 mg/dL — ABNORMAL HIGH (ref 0.57–1.00)
GFR calc Af Amer: 57 mL/min/{1.73_m2} — ABNORMAL LOW (ref >59)
GFR calc non Af Amer: 50 mL/min/{1.73_m2} — ABNORMAL LOW (ref >59)
Glucose: 101 mg/dL — ABNORMAL HIGH (ref 65–99)
Potassium: 3.7 mmol/L (ref 3.5–5.2)
Sodium: 139 mmol/L (ref 134–144)

## 2018-04-26 NOTE — Telephone Encounter (Signed)
Ok to cancel the July appt

## 2018-05-15 ENCOUNTER — Encounter: Payer: Self-pay | Admitting: Cardiovascular Disease

## 2018-05-15 NOTE — Telephone Encounter (Signed)
Called the pt re: her My Chart message in regards to her BP. She has sent [place] a list of readings every morning before taking her Losartan. She reports that she is feeling well. She is a little anxious re: her BP systolic running in the 782'U. I asked her to take a few BP's a few hours after taking her Losartan and to let us know. I reassured her that her BP was going to read a bit higher in the AM prior to taking her BP meds and will be higher after drinking her coffee. I advised her that I will forward the information on to Michelle/ Dr. Acie Fredrickson. I also urged her to keep her appt next with Sharyn Lull for her BP check and Bmet. Pt to call back if anything changes or if she develops any further questions.

## 2018-05-22 ENCOUNTER — Other Ambulatory Visit: Payer: Self-pay | Admitting: Nurse Practitioner

## 2018-05-22 DIAGNOSIS — I10 Essential (primary) hypertension: Secondary | ICD-10-CM

## 2018-05-23 ENCOUNTER — Other Ambulatory Visit: Payer: PPO

## 2018-05-23 ENCOUNTER — Ambulatory Visit (INDEPENDENT_AMBULATORY_CARE_PROVIDER_SITE_OTHER): Payer: PPO | Admitting: Nurse Practitioner

## 2018-05-23 VITALS — BP 136/74 | HR 64 | Ht 60.0 in | Wt 129.0 lb

## 2018-05-23 DIAGNOSIS — I10 Essential (primary) hypertension: Secondary | ICD-10-CM | POA: Diagnosis not present

## 2018-05-23 NOTE — Progress Notes (Signed)
1.) Reason for visit: BP check  2.) Name of MD requesting visit: Dr. Acie Fredrickson  3.) H&P: patient returns for a 3 week BP check after adding  Losartan 50 mg daily to her daily regimen   4.) ROS related to problem: Patient states she is feeling well. She brings her BP log which shows majority of BP readings 292-446 mmHg systolic and 28-63 mmHg diastolic readings. HR generally 60's bpm.   5.) Assessment and plan per MD: continue current medications and keep f/u appointment with Dr. Acie Fredrickson for 8/30

## 2018-05-23 NOTE — Patient Instructions (Addendum)
Medication Instructions:  Your physician recommends that you continue on your current medications as directed. Please refer to the Current Medication list given to you today.   Labwork: Sharyn Lull will call you with results   Testing/Procedures: None Ordered   Follow-Up: Your physician recommends that you keep your follow-up appointment on August 30 at 8:40 with Dr. Acie Fredrickson   If you need a refill on your cardiac medications before your next appointment, please call your pharmacy.   Thank you for choosing [facility]! Christen Bame, RN [phone]

## 2018-05-24 LAB — BASIC METABOLIC PANEL
BUN/Creatinine Ratio: 12 (ref 12–28)
BUN: 12 mg/dL (ref 8–27)
CO2: 24 mmol/L (ref 20–29)
Calcium: 9.3 mg/dL (ref 8.7–10.3)
Chloride: 99 mmol/L (ref 96–106)
Creatinine, Ser: 1 mg/dL (ref 0.57–1.00)
GFR calc Af Amer: 67 mL/min/{1.73_m2} (ref >59)
GFR calc non Af Amer: 58 mL/min/{1.73_m2} — ABNORMAL LOW (ref >59)
Glucose: 111 mg/dL — ABNORMAL HIGH (ref 65–99)
Potassium: 4 mmol/L (ref 3.5–5.2)
Sodium: 139 mmol/L (ref 134–144)

## 2018-06-29 ENCOUNTER — Ambulatory Visit: Payer: PPO | Admitting: Internal Medicine

## 2018-08-04 ENCOUNTER — Ambulatory Visit (INDEPENDENT_AMBULATORY_CARE_PROVIDER_SITE_OTHER): Payer: PPO | Admitting: Cardiovascular Disease

## 2018-08-04 ENCOUNTER — Encounter: Payer: Self-pay | Admitting: Cardiovascular Disease

## 2018-08-04 VITALS — BP 138/78 | HR 56 | Ht 60.0 in | Wt 132.0 lb

## 2018-08-04 DIAGNOSIS — I10 Essential (primary) hypertension: Secondary | ICD-10-CM

## 2018-08-04 DIAGNOSIS — R002 Palpitations: Secondary | ICD-10-CM | POA: Diagnosis not present

## 2018-08-04 MED ORDER — LOSARTAN POTASSIUM 50 MG PO TABS: 50.0000 mg | ORAL_TABLET | Freq: Every day | ORAL | 3 refills | Status: DC

## 2018-08-04 MED ORDER — HYDROCHLOROTHIAZIDE 25 MG PO TABS: 25.0000 mg | ORAL_TABLET | Freq: Every day | ORAL | 3 refills | Status: DC

## 2018-08-04 MED ORDER — CARVEDILOL 6.25 MG PO TABS: 6.2500 mg | ORAL_TABLET | Freq: Two times a day (BID) | ORAL | 3 refills | Status: DC

## 2018-08-04 NOTE — Patient Instructions (Signed)
Medication Instructions:  Your physician recommends that you continue on your current medications as directed. Please refer to the Current Medication list given to you today.   Labwork: None Ordered   Testing/Procedures: None Ordered   Follow-Up: Your physician wants you to follow-up in: 1 year with Dr. Nahser.  You will receive a reminder letter in the mail two months in advance. If you don't receive a letter, please call our office to schedule the follow-up appointment.   If you need a refill on your cardiac medications before your next appointment, please call your pharmacy.   Thank you for choosing [facility]! Milderd Manocchio, RN [phone]    

## 2018-08-04 NOTE — Progress Notes (Signed)
Cardiology Office Note:    Date:  08/04/2018   ID:  Destiny Cruz, DOB [birth year], MRN [id number]  PCP:  Burnis Medin, MD  Cardiologist:  Mertie Moores, MD   Referring MD: Burnis Medin, MD   Chief Complaint  Patient presents with  . Hypertension    Apr 05, 2018   Destiny Cruz is a 67 y.o. female with a hx of HTN for years  Much higher this past year .  Amlodipine 5 mg a day , then increased to 10 mg a day  Has been having PVC s - Coreg 12.5  BID was added  She brought her blood pressure log with her.  A lot of her readings over the past month have been in the 1 50-1 60 range.  With these recent increasing medication doses her blood pressures typically in the 1 30-1 40 range.  Occasionally her blood pressure is well controlled but she still has occasional systolic blood pressures of 160.   No severely blood elevated blood pressure since doubling the amlodipine last week.  Does not eat salt  Has been losing weight on her diet .   She walks 30 minuets a day ,   Is a retired Licensed conveyancer   Father died of a hemorhagic stroke , mother had afib.  No CP , no dyspnea.    Aug. 30, 2019:   And is seen today for follow-up visit.  She has a history of hypertension.  We added hydrochlorthiazide 25  mg a day as well as potassium chloride 10 mEq a day at her last visit.  Her blood pressure is a bit better. On Coreg 6. 25  BID . Walks every day / most days  3-4 miles a day  Still has some occasional episodes of dizziness. Admits that she does not drink enough Has Raynauds syndrome - has symptoms in hot and cold weather Her BP meds seem to be helping this problem    Past Medical History:  Diagnosis Date  . Chicken pox   . Duodenal ulcer    clinical dx when younger responded to med and no recurrance no bleeding  . Gestational diabetes   . Gluten intolerance    possible celiac  see text   . History of scarlatina   . Hx of allergy    as a child hay fever   .  Migraines   . UTI (lower urinary tract infection)    once     Past Surgical History:  Procedure Laterality Date  . CESAREAN SECTION  3710,6269    Current Medications: Current Meds  Medication Sig  . Cholecalciferol (VITAMIN D-3 PO) Take 4,000 Units by mouth daily.  . hydrochlorothiazide (HYDRODIURIL) 25 MG tablet Take 1 tablet (25 mg total) by mouth daily.  Marland Kitchen losartan (COZAAR) 50 MG tablet Take 1 tablet (50 mg total) by mouth daily.  . potassium chloride (K-DUR) 10 MEQ tablet Take 1 tablet (10 mEq total) by mouth daily.  . [DISCONTINUED] carvedilol (COREG) 6.25 MG tablet Take 1 tablet (6.25 mg total) by mouth 2 (two) times daily with a meal. Increase to 12.5 po bid as directed.  . [DISCONTINUED] hydrochlorothiazide (HYDRODIURIL) 25 MG tablet Take 1 tablet (25 mg total) by mouth daily.  . [DISCONTINUED] losartan (COZAAR) 50 MG tablet Take 1 tablet (50 mg total) by mouth daily.     Allergies:   Keflex [cephalexin]; Penicillins; and Sulfa antibiotics   Social History   Socioeconomic History  .  Marital status: Married    Spouse name: Not on file  . Number of children: Not on file  . Years of education: Not on file  . Highest education level: Not on file  Occupational History  . Occupation: Retired  Scientific laboratory technician  . Financial resource strain: Not on file  . Food insecurity:    Worry: Not on file    Inability: Not on file  . Transportation needs:    Medical: Not on file    Non-medical: Not on file  Tobacco Use  . Smoking status: Never Smoker  . Smokeless tobacco: Never Used  Substance and Sexual Activity  . Alcohol use: Yes    Alcohol/week: 0.0 standard drinks    Comment: 1-2 Glasses of wine per day  . Drug use: No  . Sexual activity: Yes    Partners: Male  Lifestyle  . Physical activity:    Days per week: Not on file    Minutes per session: Not on file  . Stress: Not on file  Relationships  . Social connections:    Talks on phone: Not on file    Gets together:  Not on file    Attends religious service: Not on file    Active member of club or organization: Not on file    Attends meetings of clubs or organizations: Not on file    Relationship status: Not on file  Other Topics Concern  . 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  ROS:   Please see the history of present illness.       EKGs/Labs/Other Studies Reviewed:       EKG:   -Apr 05, 2018:    Sinus bradycardia 55 beats minute.  She has U waves.  Recent Labs: 01/26/2018: ALT 13; Hemoglobin 14.1; Platelets 197.0; TSH 3.58 04/05/2018: Magnesium 2.1 05/23/2018: BUN 12; Creatinine, Ser 1.00; Potassium 4.0; Sodium 139  Recent Lipid Panel    Component Value Date/Time   CHOL 240 (H) 01/26/2018 0750   TRIG 147.0 01/26/2018 0750   HDL 56.20 01/26/2018 0750   CHOLHDL 4 01/26/2018 0750   VLDL 29.4 01/26/2018 0750   LDLCALC 155 (H) 01/26/2018 0750    Physical Exam: Blood pressure 138/78, pulse (!) 56, height 5' (1.524 m), weight 132 lb (59.9 kg), SpO2 96 %.  GEN:  Well nourished, well developed in no acute distress HEENT: Normal NECK: No JVD; No carotid bruits LYMPHATICS: No lymphadenopathy CARDIAC: RRR   RESPIRATORY:  Clear to auscultation without rales, wheezing or rhonchi  ABDOMEN: Soft, non-tender, non-distended MUSCULOSKELETAL:  No edema; No deformity  SKIN: Warm and dry NEUROLOGIC:  Alert and oriented x 3    ASSESSMENT:    No diagnosis found. PLAN:    In order of problems listed above:  HTN -   blood pressure is very well controlled.  We will continue the same medications.  2. PVCs -  Asymptomatic    Medication  Adjustments/Labs and Tests Ordered: Current medicines are reviewed at length with the patient today.  Concerns regarding medicines are outlined above.  No orders of the defined  types were placed in this encounter.  Meds ordered this encounter  Medications  . losartan (COZAAR) 50 MG tablet    Sig: Take 1 tablet (50 mg total) by mouth daily.    Dispense:  90 tablet    Refill:  3  . hydrochlorothiazide (HYDRODIURIL) 25 MG tablet    Sig: Take 1 tablet (25 mg total) by mouth daily.    Dispense:  90 tablet    Refill:  3  . carvedilol (COREG) 6.25 MG tablet    Sig: Take 1 tablet (6.25 mg total) by mouth 2 (two) times daily.    Dispense:  180 tablet    Refill:  3    Patient Instructions  Medication Instructions:  Your physician recommends that you continue on your current medications as directed. Please refer to the Current Medication list given to you today.   Labwork: None Ordered   Testing/Procedures: None Ordered   Follow-Up: Your physician wants you to follow-up in: 1 year with Dr. Acie Fredrickson.  You will receive a reminder letter in the mail two months in advance. If you don't receive a letter, please call our office to schedule the follow-up appointment.   If you need a refill on your cardiac medications before your next appointment, please call your pharmacy.   Thank you for choosing [facility]! Christen Bame, RN [phone]       Signed, Mertie Moores, MD  08/04/2018 5:06 PM    [place]

## 2018-10-15 ENCOUNTER — Other Ambulatory Visit: Payer: Self-pay | Admitting: Cardiovascular Disease

## 2018-10-17 NOTE — Telephone Encounter (Signed)
Outpatient Medication Detail    Disp Refills Start End   carvedilol (COREG) 6.25 MG tablet 180 tablet 3 08/04/2018    Sig - Route: Take 1 tablet (6.25 mg total) by mouth 2 (two) times daily. - Oral   Sent to pharmacy as: carvedilol (COREG) 6.25 MG tablet   E-Prescribing Status: Receipt confirmed by pharmacy (08/04/2018 9:20 AM EDT)   [place], [address]

## 2018-11-27 DIAGNOSIS — H25013 Cortical age-related cataract, bilateral: Secondary | ICD-10-CM | POA: Diagnosis not present

## 2018-11-27 DIAGNOSIS — H04123 Dry eye syndrome of bilateral lacrimal glands: Secondary | ICD-10-CM | POA: Diagnosis not present

## 2018-12-12 ENCOUNTER — Encounter: Payer: PPO | Admitting: Internal Medicine

## 2019-02-26 ENCOUNTER — Encounter: Payer: PPO | Admitting: Internal Medicine

## 2019-03-20 ENCOUNTER — Encounter: Payer: PPO | Admitting: Internal Medicine

## 2019-03-23 ENCOUNTER — Other Ambulatory Visit: Payer: Self-pay | Admitting: Cardiovascular Disease

## 2019-04-02 ENCOUNTER — Other Ambulatory Visit: Payer: Self-pay | Admitting: Nurse Practitioner

## 2019-04-02 ENCOUNTER — Emergency Department (HOSPITAL_COMMUNITY): Payer: PPO

## 2019-04-02 ENCOUNTER — Encounter (HOSPITAL_COMMUNITY): Payer: Self-pay | Admitting: Emergency Medicine

## 2019-04-02 ENCOUNTER — Emergency Department (HOSPITAL_COMMUNITY)
Admission: EM | Admit: 2019-04-02 | Discharge: 2019-04-02 | Disposition: A | Discharge status: Home / Self Care | Payer: PPO | Attending: Emergency Medicine | Admitting: Emergency Medicine

## 2019-04-02 ENCOUNTER — Other Ambulatory Visit: Payer: Self-pay

## 2019-04-02 DIAGNOSIS — R9431 Abnormal electrocardiogram [ECG] [EKG]: Secondary | ICD-10-CM | POA: Diagnosis not present

## 2019-04-02 DIAGNOSIS — R457 State of emotional shock and stress, unspecified: Secondary | ICD-10-CM | POA: Diagnosis not present

## 2019-04-02 DIAGNOSIS — R911 Solitary pulmonary nodule: Secondary | ICD-10-CM | POA: Diagnosis not present

## 2019-04-02 DIAGNOSIS — R55 Syncope and collapse: Secondary | ICD-10-CM | POA: Insufficient documentation

## 2019-04-02 DIAGNOSIS — R42 Dizziness and giddiness: Secondary | ICD-10-CM | POA: Diagnosis not present

## 2019-04-02 DIAGNOSIS — I10 Essential (primary) hypertension: Secondary | ICD-10-CM | POA: Diagnosis not present

## 2019-04-02 DIAGNOSIS — R110 Nausea: Secondary | ICD-10-CM | POA: Diagnosis not present

## 2019-04-02 LAB — BASIC METABOLIC PANEL
Anion gap: 20 — ABNORMAL HIGH (ref 5–15)
Anion gap: 15 (ref 5–15)
BUN: 10 mg/dL (ref 8–23)
BUN: 6 mg/dL — ABNORMAL LOW (ref 8–23)
CO2: 20 mmol/L — ABNORMAL LOW (ref 22–32)
CO2: 20 mmol/L — ABNORMAL LOW (ref 22–32)
Calcium: 9 mg/dL (ref 8.9–10.3)
Calcium: 8.6 mg/dL — ABNORMAL LOW (ref 8.9–10.3)
Chloride: 91 mmol/L — ABNORMAL LOW (ref 98–111)
Chloride: 98 mmol/L (ref 98–111)
Creatinine, Ser: 0.85 mg/dL (ref 0.44–1.00)
Creatinine, Ser: 0.71 mg/dL (ref 0.44–1.00)
GFR calc Af Amer: >60 mL/min (ref >60)
GFR calc Af Amer: >60 mL/min (ref >60)
GFR calc non Af Amer: >60 mL/min (ref >60)
GFR calc non Af Amer: >60 mL/min (ref >60)
Glucose, Bld: 88 mg/dL (ref 70–99)
Glucose, Bld: 86 mg/dL (ref 70–99)
Potassium: 3.4 mmol/L — ABNORMAL LOW (ref 3.5–5.1)
Potassium: 3.6 mmol/L (ref 3.5–5.1)
Sodium: 131 mmol/L — ABNORMAL LOW (ref 135–145)
Sodium: 133 mmol/L — ABNORMAL LOW (ref 135–145)

## 2019-04-02 LAB — CBG MONITORING, ED: Glucose-Capillary: 85 mg/dL (ref 70–99)

## 2019-04-02 LAB — URINALYSIS, ROUTINE W REFLEX MICROSCOPIC
Bilirubin Urine: NEGATIVE
Glucose, UA: NEGATIVE mg/dL
Ketones, ur: 80 mg/dL — AB
Leukocytes,Ua: NEGATIVE
Nitrite: NEGATIVE
Protein, ur: NEGATIVE mg/dL
Specific Gravity, Urine: 1.01 (ref 1.005–1.030)
pH: 6 (ref 5.0–8.0)

## 2019-04-02 LAB — CBC
HCT: 39 % (ref 36.0–46.0)
Hemoglobin: 13.4 g/dL (ref 12.0–15.0)
MCH: 30.5 pg (ref 26.0–34.0)
MCHC: 34.4 g/dL (ref 30.0–36.0)
MCV: 88.8 fL (ref 80.0–100.0)
Platelets: 226 10*3/uL (ref 150–400)
RBC: 4.39 MIL/uL (ref 3.87–5.11)
RDW: 11.4 % — ABNORMAL LOW (ref 11.5–15.5)
WBC: 7.7 10*3/uL (ref 4.0–10.5)
nRBC: 0 % (ref 0.0–0.2)

## 2019-04-02 LAB — TROPONIN I: Troponin I: <0.03 ng/mL (ref <0.03)

## 2019-04-02 MED ORDER — SODIUM CHLORIDE 0.9 % IV BOLUS
500.0000 mL | Freq: Once | INTRAVENOUS | Status: AC
  Administered 2019-04-02: 500 mL via INTRAVENOUS

## 2019-04-02 MED ORDER — SODIUM CHLORIDE 0.9 % IV BOLUS
500.0000 mL | Freq: Once | INTRAVENOUS | Status: AC
  Administered 2019-04-02: 08:00:00 500 mL via INTRAVENOUS

## 2019-04-02 MED ORDER — SODIUM CHLORIDE 0.9% FLUSH
3.0000 mL | Freq: Once | INTRAVENOUS | Status: AC
  Administered 2019-04-02: 07:00:00 3 mL via INTRAVENOUS

## 2019-04-02 NOTE — ED Provider Notes (Signed)
Care assumed at shift change from [place], [place]. See her note for full HPI and workup. Briefly, pt presenting with an episode of syncope that occurred around 1-2am this morning.  Patient got up to use the restroom, felt dizzy while ambulating to the toilet, and had a syncopal episode while on the toilet.  No head trauma.  Exam today reveals no neuro deficits. No cardiac sx, unlikely cardiac etiology.  Suspected to be volume depletion.  Patient is on HCTZ, and with a new diet with less carbs and water intake.  She has been asymptomatic here.  Labs reveal low bicarb and elevated gap.  Chest x-ray was incidental finding of lung nodule, this was discussed with patient by previous provider.  Patient aware she needs nonemergent outpatient CT scan for follow-up by PCP.  EKG without new arrhythmia.  Plan to follow up pending troponin, ambulate, orthostatics, and re-evaluate. Recheck electrolytes for improvement in gap after IV fluids. Physical Exam  BP (!) 147/83 (BP Location: Right Arm)   Pulse 66   Temp 98.4 F (36.9 C) (Oral)   Resp 12   Ht 5' (1.524 m)   Wt 57.6 kg   SpO2 100%   BMI 24.80 kg/m   Physical Exam Vitals signs and nursing note reviewed.  Constitutional:      General: She is not in acute distress.    Appearance: She is well-developed.  HENT:     Head: Normocephalic and atraumatic.  Eyes:     Conjunctiva/sclera: Conjunctivae normal.  Cardiovascular:     Rate and Rhythm: Normal rate.  Pulmonary:     Effort: Pulmonary effort is normal.  Abdominal:     Palpations: Abdomen is soft.  Skin:    General: Skin is warm.  Neurological:     Mental Status: She is alert.  Psychiatric:        Behavior: Behavior normal.     ED Course/Procedures   Results for orders placed or performed during the hospital encounter of 40/98/11  Basic metabolic panel  Result Value Ref Range   Sodium 131 (L) 135 - 145 mmol/L   Potassium 3.4 (L) 3.5 - 5.1 mmol/L   Chloride 91 (L) 98 - 111 mmol/L   CO2  20 (L) 22 - 32 mmol/L   Glucose, Bld 88 70 - 99 mg/dL   BUN 10 8 - 23 mg/dL   Creatinine, Ser 0.85 0.44 - 1.00 mg/dL   Calcium 9.0 8.9 - 10.3 mg/dL   GFR calc non Af Amer >60 >60 mL/min   GFR calc Af Amer >60 >60 mL/min   Anion gap 20 (H) 5 - 15  CBC  Result Value Ref Range   WBC 7.7 4.0 - 10.5 K/uL   RBC 4.39 3.87 - 5.11 MIL/uL   Hemoglobin 13.4 12.0 - 15.0 g/dL   HCT 39.0 36.0 - 46.0 %   MCV 88.8 80.0 - 100.0 fL   MCH 30.5 26.0 - 34.0 pg   MCHC 34.4 30.0 - 36.0 g/dL   RDW 11.4 (L) 11.5 - 15.5 %   Platelets 226 150 - 400 K/uL   nRBC 0.0 0.0 - 0.2 %  Urinalysis, Routine w reflex microscopic  Result Value Ref Range   Color, Urine STRAW (A) YELLOW   APPearance CLEAR CLEAR   Specific Gravity, Urine 1.010 1.005 - 1.030   pH 6.0 5.0 - 8.0   Glucose, UA NEGATIVE NEGATIVE mg/dL   Hgb urine dipstick SMALL (A) NEGATIVE   Bilirubin Urine NEGATIVE NEGATIVE  Ketones, ur 80 (A) NEGATIVE mg/dL   Protein, ur NEGATIVE NEGATIVE mg/dL   Nitrite NEGATIVE NEGATIVE   Leukocytes,Ua NEGATIVE NEGATIVE   RBC / HPF 0-5 0 - 5 RBC/hpf   WBC, UA 0-5 0 - 5 WBC/hpf   Bacteria, UA RARE (A) NONE SEEN   Squamous Epithelial / LPF 0-5 0 - 5   Mucus PRESENT   Troponin I - ONCE - STAT  Result Value Ref Range   Troponin I <0.03 <0.03 ng/mL  Basic metabolic panel  Result Value Ref Range   Sodium 133 (L) 135 - 145 mmol/L   Potassium 3.6 3.5 - 5.1 mmol/L   Chloride 98 98 - 111 mmol/L   CO2 20 (L) 22 - 32 mmol/L   Glucose, Bld 86 70 - 99 mg/dL   BUN 6 (L) 8 - 23 mg/dL   Creatinine, Ser 0.71 0.44 - 1.00 mg/dL   Calcium 8.6 (L) 8.9 - 10.3 mg/dL   GFR calc non Af Amer >60 >60 mL/min   GFR calc Af Amer >60 >60 mL/min   Anion gap 15 5 - 15  CBG monitoring, ED  Result Value Ref Range   Glucose-Capillary 85 70 - 99 mg/dL   Dg Chest Portable 1 View  Result Date: 04/02/2019 CLINICAL DATA:  Syncope. EXAM: PORTABLE CHEST 1 VIEW COMPARISON:  None. FINDINGS: The heart size and mediastinal contours are within  normal limits. No pneumothorax or pleural effusion is noted. Left lung is clear. Nodular density seen in right midlung. No consolidative process is noted. The visualized skeletal structures are unremarkable. IMPRESSION: Nodular density seen in right midlung concerning for pulmonary nodule. CT scan of the chest is recommended for further evaluation. Electronically Signed   By: Marijo Conception M.D.   On: 04/02/2019 07:07    Clinical Course as of Apr 02 1103  Mon Apr 02, 2019  1011 Pt re-evaluated and is still asymptomatic. Ambulated without difficulty or lightheadedness. Discussed all labs results and pending repeat BMP. Pt agreeable to plan at this time.   [JR]  1102 Gap much improved. Discussed with Dr. Laverta Baltimore. Will discharge at this time. Pt has close PCP follow up.  Basic metabolic panel(!) [JR]    Clinical Course User Index [JR] Robinson, [place] N, PA-C    Procedures  MDM  Troponin is normal.  Urine with ketones, no evidence of infection.  Orthostatics are normal.  Patient received 1 L of IV fluids and remains asymptomatic.  She was ambulated in the emergency department that lightheadedness or any symptoms.  Repeat metabolic panel reveals a much improved anion gap to 15.  Sodium and potassium with some improvement as well.  Patient has close PCP follow-up.  At this time she is safe for discharge with return precautions.  Discussed results, findings, treatment and follow up. Patient advised of return precautions. Patient verbalized understanding and agreed with plan.       Robinson, [place] N, PA-C 04/02/19 1111    Mesner, Corene Cornea, MD 04/06/19 2326

## 2019-04-02 NOTE — ED Triage Notes (Signed)
BIB [facility] from home. EMS called out for syncopal episode at 0130. Denies hitting head. EMS called out again when pt began to have feeling of having to throw up. Pt states this feeling is usually associated with her high blood pressure.

## 2019-04-02 NOTE — ED Provider Notes (Signed)
[facility] EMERGENCY DEPARTMENT Provider Note   CSN: [id number] Arrival date & time: 04/02/19  0542    History   Chief Complaint Chief Complaint  Patient presents with  . Loss of Consciousness    HPI Destiny Cruz is a 68 y.o. female.     HPI  Patient is a 68 year old female with a past medical history of hypertension, osteoporosis, presenting for syncope.  Patient reports she was on her way to the restroom today when she felt lightheaded while walking to the bathroom.  Patient reports that she has felt this way over the past couple of days.  Patient reports that she does not exactly recall what happened, however when she was sitting on the toilet she syncopized and fell over against the sink.  She did not hit her head or fall to the ground.  She reports that her husband heard a noise and came to the bathroom to find her.  She denies any chest pain, shortness of breath, headaches, visual disturbance, weakness or numbness in extremities, nausea, vomiting, diaphoresis.  Patient denies any recent illnesses, respiratory or GI.  Patient reports that she did start a new diet a couple weeks ago where she is restricting carbohydrates.  She also believes that she has not been drinking enough water.  She does take hydrochlorothiazide as a diuretic, and is on Coreg and losartan.  No recent changes in her blood pressure regimen.  Patient denies any history of DVT/PE, postmenopausal hormone use, recent mobilization, hospitalization, recent surgery, lower extremity edema or calf tenderness or hemoptysis.  Past Medical History:  Diagnosis Date  . Chicken pox   . Duodenal ulcer    clinical dx when younger responded to med and no recurrance no bleeding  . Gestational diabetes   . Gluten intolerance    possible celiac  see text   . History of scarlatina   . Hx of allergy    as a child hay fever   . Migraines   . UTI (lower urinary tract infection)    once     Patient  Active Problem List   Diagnosis Date Noted  . Positive colorectal cancer screening using Cologuard test 03/01/2018  . Palpitations 02/27/2018  . Abnormal electrocardiogram (ECG) (EKG) 02/27/2018  . Labile hypertension 02/27/2018  . Essential hypertension 01/30/2018  . Vitamin D deficiency 12/08/2015  . Family history of colon cancer father 12/08/2015  . Osteoporosis 12/03/2015  . Gluten intolerance 12/03/2015  . Hx gestational diabetes 12/03/2015    Past Surgical History:  Procedure Laterality Date  . CESAREAN SECTION  3220,2542     OB History    Gravida  2   Para  2   Term      Preterm      AB      Living        SAB      TAB      Ectopic      Multiple      Live Births               Home Medications    Prior to Admission medications   Medication Sig Start Date End Date Taking? Authorizing Provider  carvedilol (COREG) 6.25 MG tablet Take 1 tablet (6.25 mg total) by mouth 2 (two) times daily. 08/04/18   Nahser, Wonda Cheng, MD  Cholecalciferol (VITAMIN D-3 PO) Take 4,000 Units by mouth daily.    [provider]  hydrochlorothiazide (HYDRODIURIL) 25 MG tablet Take  1 tablet (25 mg total) by mouth daily. 08/04/18   Nahser, Wonda Cheng, MD  losartan (COZAAR) 50 MG tablet Take 1 tablet (50 mg total) by mouth daily. 08/04/18   Nahser, Wonda Cheng, MD  potassium chloride (K-DUR) 10 MEQ tablet TAKE 1 TABLET BY MOUTH DAILY. 03/23/19   Nahser, Wonda Cheng, MD    Family History Family History  Problem Relation Age of Onset  . Colon cancer Father   . Stroke Father   . Rheum arthritis Mother   . Thyroid disease Daughter        partial thyroidectomy   . Celiac disease Son        probable    Social History Social History   Tobacco Use  . Smoking status: Never Smoker  . Smokeless tobacco: Never Used  Substance Use Topics  . Alcohol use: Yes    Alcohol/week: 0.0 standard drinks    Comment: 1-2 Glasses of wine per day  . Drug use: No     Allergies   Keflex  [cephalexin]; Penicillins; and Sulfa antibiotics   Review of Systems Review of Systems  Constitutional: Negative for chills and fever.  HENT: Negative for congestion, rhinorrhea, sinus pain and sore throat.   Eyes: Negative for visual disturbance.  Respiratory: Negative for cough, chest tightness and shortness of breath.   Cardiovascular: Negative for chest pain, palpitations and leg swelling.  Gastrointestinal: Negative for abdominal pain, nausea and vomiting.  Genitourinary: Negative for dysuria and flank pain.  Musculoskeletal: Negative for back pain and myalgias.  Skin: Negative for rash.  Neurological: Positive for syncope and light-headedness. Negative for dizziness, weakness, numbness and headaches.     Physical Exam Updated Vital Signs BP (!) 147/83 (BP Location: Right Arm)   Pulse 66   Temp 98.4 F (36.9 C) (Oral)   Resp 12   Ht 5' (1.524 m)   Wt 57.6 kg   SpO2 100%   BMI 24.80 kg/m   Physical Exam Vitals signs and nursing note reviewed.  Constitutional:      General: She is not in acute distress.    Appearance: She is well-developed.  HENT:     Head: Normocephalic and atraumatic.     Mouth/Throat:     Mouth: Mucous membranes are moist.  Eyes:     Extraocular Movements: Extraocular movements intact.     Conjunctiva/sclera: Conjunctivae normal.     Pupils: Pupils are equal, round, and reactive to light.  Neck:     Musculoskeletal: Normal range of motion and neck supple.  Cardiovascular:     Rate and Rhythm: Normal rate and regular rhythm.     Pulses:          Dorsalis pedis pulses are 2+ on the right side and 2+ on the left side.       Posterior tibial pulses are 2+ on the right side and 2+ on the left side.     Heart sounds: Normal heart sounds, S1 normal and S2 normal. No murmur.     Comments: No LE edema. No calf tenderness.  Pulmonary:     Effort: Pulmonary effort is normal.     Breath sounds: Normal breath sounds. No wheezing or rales.  Abdominal:      General: There is no distension.     Palpations: Abdomen is soft.     Tenderness: There is no abdominal tenderness. There is no guarding.  Musculoskeletal: Normal range of motion.        General: No deformity.  Lymphadenopathy:     Cervical: No cervical adenopathy.  Skin:    General: Skin is warm and dry.     Findings: No erythema or rash.  Neurological:     Mental Status: She is alert.     Comments: Cranial nerves grossly intact.  Normal speech. Patient moves extremities symmetrically and with good coordination.  Psychiatric:        Behavior: Behavior normal.        Thought Content: Thought content normal.        Judgment: Judgment normal.      ED Treatments / Results  Labs (all labs ordered are listed, but only abnormal results are displayed) Labs Reviewed  BASIC METABOLIC PANEL - Abnormal; Notable for the following components:      Result Value   Sodium 131 (*)    Potassium 3.4 (*)    Chloride 91 (*)    CO2 20 (*)    Anion gap 20 (*)    All other components within normal limits  CBC - Abnormal; Notable for the following components:   RDW 11.4 (*)    All other components within normal limits  TROPONIN I  URINALYSIS, ROUTINE W REFLEX MICROSCOPIC  CBG MONITORING, ED    EKG EKG Interpretation  Date/Time:  Monday April 02 2019 05:51:38 EDT Ventricular Rate:  66 PR Interval:    QRS Duration: 85 QT Interval:  450 QTC Calculation: 472 R Axis:   -76 Text Interpretation:  Sinus rhythm Left anterior fascicular block Abnormal R-wave progression, late transition mildly worsened TWI in III No significant change since last tracing Confirmed by Merrily Pew [phone]) on 04/02/2019 6:26:31 AM   Radiology Dg Chest Portable 1 View  Result Date: 04/02/2019 CLINICAL DATA:  Syncope. EXAM: PORTABLE CHEST 1 VIEW COMPARISON:  None. FINDINGS: The heart size and mediastinal contours are within normal limits. No pneumothorax or pleural effusion is noted. Left lung is clear. Nodular  density seen in right midlung. No consolidative process is noted. The visualized skeletal structures are unremarkable. IMPRESSION: Nodular density seen in right midlung concerning for pulmonary nodule. CT scan of the chest is recommended for further evaluation. Electronically Signed   By: Marijo Conception M.D.   On: 04/02/2019 07:07    Procedures Procedures (including critical care time)  Medications Ordered in ED Medications  sodium chloride flush (NS) 0.9 % injection 3 mL (3 mLs Intravenous Given 04/02/19 0642)  sodium chloride 0.9 % bolus 500 mL (500 mLs Intravenous New Bag/Given 04/02/19 9242)     Initial Impression / Assessment and Plan / ED Course  I have reviewed the triage vital signs and the nursing notes.  Pertinent labs & imaging results that were available during my care of the patient were reviewed by me and considered in my medical decision making (see chart for details).        DDx includes: Orthostatic hypotension Stroke Vertebral artery dissection/stenosis Dysrhythmia PE Vasovagal/neurocardiogenic syncope Aortic stenosis Valvular disorder/Cardiomyopathy Anemia  This is a 68 year old previously healthy female with history of hypertension on multiple antihypertensives presenting for syncope.  Syncope does not appear to have a cardiogenic or neurologic origin by history or exam.  Suspect orthostatic syncope secondary to volume depletion.   EKG normal sinus rhythm without evidence of ischemia, infarction, arrhythmia, or QTC prolongation or other abnormal intervals.  Workup demonstrating hyponatremia of 131.  Potassium is 3.4.  Chloride 91 and CO2 of 20.  Patient has an anion gap of 20.  Suspect that patient's electrolyte  anomalies are due to her diuretic and volume depletion.  Will provide fluid resuscitation and reassess BMP.  Patient has normal CBC.  Troponin is negative.  Chest x-ray demonstrates a area of the right lung field that appears to be nodular density.   Patient was informed of this and instructed to follow-up with her primary care provider regarding this finding.  Care signed out to Martinique Robinson, PA-C to assess after fluids, recollection of BMP, and ambulate patient.  Provided that patient is asymptomatic, and work-up is negative for any further acute findings, patient likely stable for discharge for outpatient follow-up.  She does have a cardiologist, Dr. Acie Fredrickson.   This is a shared visit with Dr. Merrily Pew. Patient was independently evaluated by this attending physician. Attending physician consulted in evaluation and management.  Final Clinical Impressions(s) / ED Diagnoses   Final diagnoses:  Syncope, unspecified syncope type  Pulmonary nodule    ED Discharge Orders    None       Tamala Julian 04/02/19 9794    Merrily Pew, MD 04/06/19 2325

## 2019-04-02 NOTE — ED Provider Notes (Signed)
Medical screening examination/treatment/procedure(s) were conducted as a shared visit with non-physician practitioner(s) and myself.  I personally evaluated the patient during the encounter.  Here with what sounds like orthostatic syncope. Vitals here WNL. Some e/o dehydration on exam and labs, plan for fluids. Orthostatics. Ambulate. reeval to ensure improving anion gap. Likely discharge. Low suspicion for neurologic or cardiac causes at this time.   EKG Interpretation  Date/Time:  Monday April 02 2019 05:51:38 EDT Ventricular Rate:  66 PR Interval:    QRS Duration: 85 QT Interval:  450 QTC Calculation: 472 R Axis:   -76 Text Interpretation:  Sinus rhythm Left anterior fascicular block Abnormal R-wave progression, late transition mildly worsened TWI in III No significant change since last tracing Confirmed by Merrily Pew [phone]) on 04/02/2019 6:26:31 AM     Destiny Cruz, Corene Cornea, MD 04/06/19 2325

## 2019-04-02 NOTE — Discharge Instructions (Signed)
It is important that you stay hydrated and eat a well-balanced diet. For any further new or worsening symptoms, you can report back to the emergency department.  Otherwise, follow-up closely with your primary care provider.

## 2019-04-03 NOTE — Telephone Encounter (Signed)
So  I saw  Her  Hospital notes and glad no concerning  Findings.   Diuretic and combo meds esp diuretic and ace arb together can   Precipitate low bp so  Usually need to titrate  Carefully . Yes you can ask dr Acie Fredrickson     But stayin goff the diuretic for now is   A good idea.   [place] please arrange a video visit for  Friday or next week  For fu of   EDevaluation    And she can give [place] some bp  Readings at that time. thanks

## 2019-04-05 NOTE — Progress Notes (Signed)
Virtual Visit via Video Note  I connected with@ on 04/06/19 at  8:45 AM EDT by a video enabled telemedicine application and verified that I am speaking with the correct person using two identifiers. Location patient: home Location provider:work  office Persons participating in the virtual visit: patient, provider and husband  Judyann Munson national recommendations  regarding COVID 19 pandemic   video visit is advised over in office visit for this patient.  Aware of  limitations of evaluation and management by telemedicine and  availability of in person appointments. The patient expressed understanding and agreed to proceed.   HPI: Destiny Cruz prsents for VV   Fu ed visit for syncope  On April 27 she passed out in the bathroom about 1:30 AM went out to go to the bathroom and after she urinated the next thing she remembers was being on the floor with her husband on the phone calling EMS.  She was a little bit confused EMS came had normal vital signs except blood pressure in the 180/80 range decided not to go to the hospital the 1 hour later felt bad but did not really faint and called EMS to go back.  Her blood pressure was 145/90-170 7/84 pulse apparently was normal.  In the emergency room she was evaluated felt to be dehydrated had been on a low-carb diet and had ketones in her urine which was felt to be a possible inciting factor.  She was given a liter of saline blood pressure was 125/69 with pulse in the 70s.  EKG no change chemistry showed a slightly low sodium originally 133 and 131 potassium 3.4 back up to 3.6.  Chest x-ray showed potential could be a pulmonary nodule on the right CT scan recommended that this was an incidental finding.  She had been put on the blood pressure medicine over time and Dr. Acie Fredrickson had added the HCTZ.  She has stopped it for the last 4 days blood pressure 177/84 145/90 but she gets anxious taking her blood pressure readings.  Her pulses are usually  normal. She is gone back to eating a bit more regular but not pushing high or low sodium.  No neurologic symptoms at this time. Her husband Darnell Level was concerned about possibly a TIA as when he found her she was sitting laying to the side with her head near the sink eyes open but unresponsive and perhaps a bubble in her mouth but no abnormal motions.  She he does not know how long she was out like that and then when he was on the phone she suddenly jolted and was normal cognition talking.    ROS: See pertinent positives and negatives per HPI.  No chest pain shortness of breath history of fainting she does have anxiety about a number of these things.  Past Medical History:  Diagnosis Date  . Chicken pox   . Duodenal ulcer    clinical dx when younger responded to med and no recurrance no bleeding  . Gestational diabetes   . Gluten intolerance    possible celiac  see text   . History of scarlatina   . Hx of allergy    as a child hay fever   . Migraines   . UTI (lower urinary tract infection)    once     Past Surgical History:  Procedure Laterality Date  . CESAREAN SECTION  [id number]    Family History  Problem Relation Age of Onset  . Colon cancer  Father   . Stroke Father   . Rheum arthritis Mother   . Thyroid disease Daughter        partial thyroidectomy   . Celiac disease Son        probable    Social History   Tobacco Use  . Smoking status: Never Smoker  . Smokeless tobacco: Never Used  Substance Use Topics  . Alcohol use: Yes    Alcohol/week: 0.0 standard drinks    Comment: 1-2 Glasses of wine per day  . Drug use: No      Current Outpatient Medications:  .  carvedilol (COREG) 6.25 MG tablet, Take 1 tablet (6.25 mg total) by mouth 2 (two) times daily., Disp: 180 tablet, Rfl: 3 .  Cholecalciferol (VITAMIN D-3 PO), Take 4,000 Units by mouth daily., Disp: , Rfl:  .  losartan (COZAAR) 50 MG tablet, Take 1 tablet (50 mg total) by mouth daily., Disp: 90 tablet, Rfl:  3  EXAM: BP Readings from Last 3 Encounters:  04/02/19 122/63  08/04/18 138/78  05/23/18 136/74    VITALS per patient if applicable: Blood pressure 170/88.  She looks well normal speech and cognition. GENERAL: alert, oriented, appears well and in no acute distress  HEENT: atraumatic, conjunttiva clear, no obvious abnormalities on inspection of external nose and ears  NECK: normal movements of the head and neck  LUNGS: on inspection no signs of respiratory distress, breathing rate appears normal, no obvious gross SOB, gasping or wheezing  CV: no obvious cyanosis  MS: moves all visible extremities without noticeable abnormality  PSYCH/NEURO: pleasant and cooperative, no obvious depression mild anxiety.  Speech and thought processing grossly intact Lab Results  Component Value Date   WBC 7.7 04/02/2019   HGB 13.4 04/02/2019   HCT 39.0 04/02/2019   PLT 226 04/02/2019   GLUCOSE 86 04/02/2019   CHOL 240 (H) 01/26/2018   TRIG 147.0 01/26/2018   HDL 56.20 01/26/2018   LDLCALC 155 (H) 01/26/2018   ALT 13 01/26/2018   AST 13 01/26/2018   NA 133 (L) 04/02/2019   K 3.6 04/02/2019   CL 98 04/02/2019   CREATININE 0.71 04/02/2019   BUN 6 (L) 04/02/2019   CO2 20 (L) 04/02/2019   TSH 3.58 01/26/2018   HGBA1C 5.2 01/26/2018  FINDINGS: The heart size and mediastinal contours are within normal limits. No pneumothorax or pleural effusion is noted. Left lung is clear. Nodular density seen in right midlung. No consolidative process is noted. The visualized skeletal structures are unremarkable.  IMPRESSION: Nodular density seen in right midlung concerning for pulmonary nodule. CT scan of the chest is recommended for further evaluation.   Electronically Signed   By: Marijo Conception M.D.   On: 04/02/2019 07:07  ASSESSMENT AND PLAN:  Discussed the following assessment and plan:  Syncope, unspecified syncope type - Plan: Ambulatory referral to Neurology, CT Chest W  Contrast  Medication management - Plan: Basic metabolic panel  Labile hypertension - Plan: Basic metabolic panel  Abnormal chest x-ray - poss lung nodules  r mid lung  check ct  - Plan: CT Chest W Contrast  Hyponatremia mild   - doesnt seem low enough to cause cns sx but at risk poss dehydration effect  stay off diuretic for now  - Plan: Basic metabolic panel  Anxiety related to health events  - reactive  from  above  c x ray off ? Need chest ct  We did not discuss this at this visit but will  Plan for this in future  incdicental finding   She may have white coat she appears to have some elevation relating to just taking her readings. Suggest her husband take readings twice a day for the next 5 days and have her not be involved in this. To avoid  Anxiety  Adding to results as possible, Sounds like a vagal syncope as occurring at night after micturition  however did last longer perhaps because she was maintained in the upright position and possibly potential with diuretic low-carb ketotic diet and question hydration. Plan neurology consult for assessment rule out neurologic causes possible husband's concerned about a TIA   Seizure other causes of loc  She will send in readings to cardiology and [place] and then make a plan for follow-up.  At this time stay off the diuretic.  There is an option to increase the carvedilol and or the losartan.  If needed for blood pressure control. Counseled.   Expectant management and discussion of plan and treatment with patient with opportunity to ask questions and all were answered. The patient agreed with the plan and demonstrated an understanding of the instructions.   The patient was advised to call back or seek an in-person evaluation if worsening  or having concerns . In interim  Will send summary to her   On my chart    Shanon Ace, MD

## 2019-04-06 ENCOUNTER — Other Ambulatory Visit: Payer: Self-pay

## 2019-04-06 ENCOUNTER — Ambulatory Visit (INDEPENDENT_AMBULATORY_CARE_PROVIDER_SITE_OTHER): Payer: PPO | Admitting: Internal Medicine

## 2019-04-06 ENCOUNTER — Encounter: Payer: Self-pay | Admitting: Internal Medicine

## 2019-04-06 DIAGNOSIS — R9389 Abnormal findings on diagnostic imaging of other specified body structures: Secondary | ICD-10-CM | POA: Diagnosis not present

## 2019-04-06 DIAGNOSIS — E871 Hypo-osmolality and hyponatremia: Secondary | ICD-10-CM | POA: Diagnosis not present

## 2019-04-06 DIAGNOSIS — Z79899 Other long term (current) drug therapy: Secondary | ICD-10-CM

## 2019-04-06 DIAGNOSIS — R55 Syncope and collapse: Secondary | ICD-10-CM | POA: Diagnosis not present

## 2019-04-06 DIAGNOSIS — F418 Other specified anxiety disorders: Secondary | ICD-10-CM | POA: Diagnosis not present

## 2019-04-06 DIAGNOSIS — R0989 Other specified symptoms and signs involving the circulatory and respiratory systems: Secondary | ICD-10-CM

## 2019-04-09 NOTE — Telephone Encounter (Signed)
Orders have been placed.

## 2019-04-17 ENCOUNTER — Encounter: Payer: Self-pay | Admitting: Neurology

## 2019-04-17 ENCOUNTER — Other Ambulatory Visit: Payer: Self-pay

## 2019-04-17 ENCOUNTER — Telehealth (INDEPENDENT_AMBULATORY_CARE_PROVIDER_SITE_OTHER): Payer: PPO | Admitting: Neurology

## 2019-04-17 VITALS — BP 134/74 | HR 67

## 2019-04-17 DIAGNOSIS — R55 Syncope and collapse: Secondary | ICD-10-CM

## 2019-04-17 NOTE — Progress Notes (Signed)
CT order placed

## 2019-04-17 NOTE — Progress Notes (Signed)
Virtual Visit via Video Note The purpose of this virtual visit is to provide medical care while limiting exposure to the novel coronavirus.    Consent was obtained for video visit:  Yes.   Answered questions that patient had about telehealth interaction:  Yes.   I discussed the limitations, risks, security and privacy concerns of performing an evaluation and management service by telemedicine. I also discussed with the patient that there may be a patient responsible charge related to this service. The patient expressed understanding and agreed to proceed.  Pt location: Home Physician Location: office Name of referring provider:  Panosh, Standley Brooking, MD I connected with Destiny Cruz at patients initiation/request on 04/17/2019 at  9:00 AM EDT by video enabled telemedicine application and verified that I am speaking with the correct person using two identifiers. Pt MRN:  [id number] Pt DOB:  [birth year] Video Participants:  Destiny Cruz;  Eustace Moore (husband)   History of Present Illness:  This is a pleasant 68 year old ambidextrous right-hand dominant woman with a history of hypertension, migraines, presenting for evaluation of a syncopal episode that occurred last 04/02/2019. She reports that 3 weeks prior to event, she started a low carb diet and was taking 3 antihypertensive medications, including HCTZ. She noticed that for a couple of days prior, she would wake up to use the bathroom and feel like she was waking up with very low BP, lightheaded like she would pass out. She was unable to check her BP during those times. On 4/27, she woke up at 1:30am to urinate and was not feeling well, lightheaded. She sat on the commode and urinated, then woke up to see her husband in the bathroom speaking on the phone. He heard a moaning and had found her sitting on the commode leaning on the sink beside it. He was worried because her eyes were open but she was not responding to his  questions. As he was talking to 911, she suddenly jolted as if electricity came through her and asked who he was talking to. She was confused as to what happened but was able to answer questions and follow commands appropriately, no focal weakness. When EMS arrived, her SBP was 180. She declined going to the ER, but then called EMS again because she could not get comfortable, feeling very agitated and nervous due to her elevated BP. When they arrived, SBP was 147. She was brought to [facility] where EKG showed sinus rhythm, left anterior fascicular block, abnormal R-wave progression, no change from last tracing. BMP showed a Na of 131, K 3.4, Cl 91, CO2 20, BUN and creatinine normal. CBC and UA normal. After IV fluids, Na 133, K 3.6, Cl 98. They felt she was dehydrated as well. She stopped HCTZ and states BP has been better. No prior episodes of syncope or seizures. She and her husband deny any other episodes of staring/unresponsiveness, gaps in time, olfactory/gustatory hallucinations, deja vu, rising epigastric sensation, focal numbness/tingling/weakness, myoclonic jerks.   She used to have migraines when younger, then started having ocular migraines later in life. After changing to a gluten-free diet, migraines have resolved. She has had at least one episode of benign positional vertigo. She denies any diplopia, dysarthria/dysphagia, neck/back pain, bowel/bladder dysfunction. She used to drink a glass of wine every night but stopped since 4/27. Her father and several paternal family members have had hemorrhagic strokes. She had a normal birth and early development.  There is no history of  febrile convulsions, CNS infections such as meningitis/encephalitis, significant traumatic brain injury, neurosurgical procedures, or family history of seizures.   PAST MEDICAL HISTORY: Past Medical History:  Diagnosis Date  . Chicken pox   . Duodenal ulcer    clinical dx when younger responded to med and no recurrance no  bleeding  . Gestational diabetes   . Gluten intolerance    possible celiac  see text   . History of scarlatina   . Hx of allergy    as a child hay fever   . Migraines   . UTI (lower urinary tract infection)    once     PAST SURGICAL HISTORY: Past Surgical History:  Procedure Laterality Date  . CESAREAN SECTION  [id number]    MEDICATIONS: Current Outpatient Medications on File Prior to Visit  Medication Sig Dispense Refill  . carvedilol (COREG) 6.25 MG tablet Take 1 tablet (6.25 mg total) by mouth 2 (two) times daily. 180 tablet 3  . Cholecalciferol (VITAMIN D-3 PO) Take 4,000 Units by mouth daily.    Marland Kitchen losartan (COZAAR) 50 MG tablet Take 1 tablet (50 mg total) by mouth daily. 90 tablet 3   No current facility-administered medications on file prior to visit.     ALLERGIES: Allergies  Allergen Reactions  . Keflex [Cephalexin] Hives  . Penicillins Hives  . Sulfa Antibiotics Hives    FAMILY HISTORY: Family History  Problem Relation Age of Onset  . Colon cancer Father   . Stroke Father   . Rheum arthritis Mother   . Thyroid disease Daughter        partial thyroidectomy   . Celiac disease Son        probable     Observations/Objective:   Vitals:   04/17/19 0934  BP: 134/74  Pulse: 67   GEN:  The patient appears stated age and is in NAD. Neurological examination: Patient is awake, alert, oriented x 3. No aphasia or dysarthria. Intact fluency and comprehension. Remote and recent memory intact. Able to name and repeat. Cranial nerves: Extraocular movements intact with no nystagmus. No facial asymmetry. Motor: moves all extremities symmetrically, at least anti-gravity x 4. No incoordination on finger to nose testing. Gait: narrow-based and steady, able to tandem walk adequately. Negative Romberg test.   Assessment and Plan:   This is a pleasant 68 year old ambidextrous right hand dominant woman with a history of hypertension, Reynaud's phenomenon, migraines, presenting  for evaluation of a syncopal episode last 04/02/2019. History strongly suggestive of vasovagal syncope. We discussed doing a head CT without contrast for completion to assess for underlying structural abnormality. Our office will contact her with results, if normal, follow-up on as needed basis, she knows to call for any change in symptoms.   Follow Up Instructions:   -I discussed the assessment and treatment plan with the patient. The patient was provided an opportunity to ask questions and all were answered. The patient agreed with the plan and demonstrated an understanding of the instructions.   The patient was advised to call back or seek an in-person evaluation if the symptoms worsen or if the condition fails to improve as anticipated.    Cameron Sprang, MD

## 2019-04-18 ENCOUNTER — Ambulatory Visit
Admission: RE | Admit: 2019-04-18 | Discharge: 2019-04-18 | Disposition: A | Discharge status: Home / Self Care | Payer: PPO | Source: Ambulatory Visit | Attending: Internal Medicine | Admitting: Internal Medicine

## 2019-04-18 DIAGNOSIS — R9389 Abnormal findings on diagnostic imaging of other specified body structures: Secondary | ICD-10-CM

## 2019-04-18 DIAGNOSIS — R55 Syncope and collapse: Secondary | ICD-10-CM

## 2019-04-18 DIAGNOSIS — R911 Solitary pulmonary nodule: Secondary | ICD-10-CM | POA: Diagnosis not present

## 2019-04-18 MED ORDER — IOPAMIDOL (ISOVUE-300) INJECTION 61%
75.0000 mL | Freq: Once | INTRAVENOUS | Status: AC | PRN
  Administered 2019-04-18: 10:00:00 75 mL via INTRAVENOUS

## 2019-04-23 ENCOUNTER — Other Ambulatory Visit: Payer: Self-pay

## 2019-04-23 ENCOUNTER — Encounter: Payer: Self-pay | Admitting: Internal Medicine

## 2019-04-23 ENCOUNTER — Telehealth (INDEPENDENT_AMBULATORY_CARE_PROVIDER_SITE_OTHER): Payer: PPO | Admitting: Cardiovascular Disease

## 2019-04-23 ENCOUNTER — Encounter: Payer: Self-pay | Admitting: Cardiovascular Disease

## 2019-04-23 VITALS — BP 140/79 | HR 69 | Ht 60.0 in | Wt 122.8 lb

## 2019-04-23 DIAGNOSIS — E7800 Pure hypercholesterolemia, unspecified: Secondary | ICD-10-CM

## 2019-04-23 DIAGNOSIS — E785 Hyperlipidemia, unspecified: Secondary | ICD-10-CM | POA: Insufficient documentation

## 2019-04-23 DIAGNOSIS — I10 Essential (primary) hypertension: Secondary | ICD-10-CM | POA: Diagnosis not present

## 2019-04-23 DIAGNOSIS — I2510 Atherosclerotic heart disease of native coronary artery without angina pectoris: Secondary | ICD-10-CM

## 2019-04-23 DIAGNOSIS — I7300 Raynaud's syndrome without gangrene: Secondary | ICD-10-CM | POA: Insufficient documentation

## 2019-04-23 DIAGNOSIS — E782 Mixed hyperlipidemia: Secondary | ICD-10-CM

## 2019-04-23 DIAGNOSIS — Z7189 Other specified counseling: Secondary | ICD-10-CM | POA: Diagnosis not present

## 2019-04-23 DIAGNOSIS — I2584 Coronary atherosclerosis due to calcified coronary lesion: Secondary | ICD-10-CM | POA: Diagnosis not present

## 2019-04-23 DIAGNOSIS — R0989 Other specified symptoms and signs involving the circulatory and respiratory systems: Secondary | ICD-10-CM

## 2019-04-23 NOTE — Progress Notes (Signed)
Virtual Visit via Video Note   This visit type was conducted due to national recommendations for restrictions regarding the COVID-19 Pandemic (e.g. social distancing) in an effort to limit this patient's exposure and mitigate transmission in our community.  Due to her co-morbid illnesses, this patient is at least at moderate risk for complications without adequate follow up.  This format is felt to be most appropriate for this patient at this time.  All issues noted in this document were discussed and addressed.  A limited physical exam was performed with this format.  Please refer to the patient's chart for her consent to telehealth for [facility].   Date:  04/23/2019   ID:  Destiny Cruz, DOB Sep 06, 1951, MRN [id number]  Patient Location: Home Provider Location: Home  PCP:  Burnis Medin, MD  Cardiologist:  Mertie Moores, MD  Electrophysiologist:  None   Evaluation Performed:  Follow-Up Visit   Apr 05, 2018   Destiny Cruz is a 68 y.o. female with a hx of HTN for years  Much higher this past year .  Amlodipine 5 mg a day , then increased to 10 mg a day  Has been having PVC s - Coreg 12.5  BID was added  She brought her blood pressure log with her.  A lot of her readings over the past month have been in the 1 50-1 60 range.  With these recent increasing medication doses her blood pressures typically in the 1 30-1 40 range.  Occasionally her blood pressure is well controlled but she still has occasional systolic blood pressures of 160.   No severely blood elevated blood pressure since doubling the amlodipine last week.  Does not eat salt  Has been losing weight on her diet .   She walks 30 minuets a day ,   Is a retired Licensed conveyancer   Father died of a hemorhagic stroke , mother had afib.  No CP , no dyspnea.    Aug. 30, 2019:   And is seen today for follow-up visit.  She has a history of hypertension.  We added hydrochlorthiazide 25  mg a day as well as  potassium chloride 10 mEq a day at her last visit.  Her blood pressure is a bit better. On Coreg 6. 25  BID . Walks every day / most days  3-4 miles a day  Still has some occasional episodes of dizziness. Admits that she does not drink enough Has Raynauds syndrome - has symptoms in hot and cold weather Her BP meds seem to be helping this problem      Chief Complaint:  HTN,  Hyperlipidemia   Apr 23, 2019   Destiny Cruz is a 68 y.o. female with HTN, hyperlipidemia .  BP readings look good  No CP  No dyspnea Walks daily  - 2-3 miles  Light weights   Pt had a CT scan ( follow up for a chest nodule )  recently that revealed coronary artery calcification.  The velocities are from February, 2019 which revealed a intact  Right total cholesterol of 240 HDL is 56 LDL is 155 Triglyceride level is in 147. Does not want to take a statin  Offered lipid clinic referral She wants to make lifestyle changes and not start any new meds.  Has gained some weight and wants to see how she does with weight loss.   Repeat labs will be done soon  Wears mask  The patient does  not have symptoms concerning for COVID-19 infection (fever, chills, cough, or new shortness of breath).    Past Medical History:  Diagnosis Date  . Chicken pox   . Duodenal ulcer    clinical dx when younger responded to med and no recurrance no bleeding  . Gestational diabetes   . Gluten intolerance    possible celiac  see text   . History of scarlatina   . Hx of allergy    as a child hay fever   . Migraines   . UTI (lower urinary tract infection)    once    Past Surgical History:  Procedure Laterality Date  . CESAREAN SECTION  [id number]     No outpatient medications have been marked as taking for the 04/23/19 encounter (Telemedicine) with Marieanne Marxen, Wonda Cheng, MD.     Allergies:   Keflex [cephalexin]; Penicillins; and Sulfa antibiotics   Social History   Tobacco Use  . Smoking status: Never Smoker   . Smokeless tobacco: Never Used  Substance Use Topics  . Alcohol use: Not Currently    Alcohol/week: 0.0 standard drinks    Comment: 1-2 Glasses of wine per day  . Drug use: No     Family Hx: The patient's family history includes Celiac disease in her son; Colon cancer in her father; Rheum arthritis in her mother; Stroke in her father; Thyroid disease in her daughter.  ROS:   Please see the history of present illness.     All other systems reviewed and are negative.   Prior CV studies:   The following studies were reviewed today:    Labs/Other Tests and Data Reviewed:    EKG:  No ECG reviewed.  Recent Labs: 04/02/2019: BUN 6; Creatinine, Ser 0.71; Hemoglobin 13.4; Platelets 226; Potassium 3.6; Sodium 133   Recent Lipid Panel Lab Results  Component Value Date/Time   CHOL 240 (H) 01/26/2018 07:50 AM   TRIG 147.0 01/26/2018 07:50 AM   HDL 56.20 01/26/2018 07:50 AM   CHOLHDL 4 01/26/2018 07:50 AM   LDLCALC 155 (H) 01/26/2018 07:50 AM    Wt Readings from Last 3 Encounters:  04/16/19 124 lb (56.2 kg)  04/02/19 127 lb (57.6 kg)  08/04/18 132 lb (59.9 kg)     Objective:    Vital Signs:  There were no vitals taken for this visit.   VITAL SIGNS:  reviewed GEN:  no acute distress EYES:  sclerae anicteric, EOMI - Extraocular Movements Intact RESPIRATORY:  normal respiratory effort, symmetric expansion CARDIOVASCULAR:  no peripheral edema SKIN:  no rash, lesions or ulcers. MUSCULOSKELETAL:  no obvious deformities. NEURO:  alert and oriented x 3, no obvious focal deficit PSYCH:  normal affect  ASSESSMENT & PLAN:    1. HTN:   BP is well controlled.  Continue meds.   2.  Hyperlipidemia :  Will see her in 4 months. Will check fasting lipids , Lipomed profile  She does not want to take a statin medication.  I reviewed the importance of reducing her cholesterol.  Her husband is on Repatha so she is well aware of the PCSK9 inhibitors.  3.   Coronary Artery  Calcification:   No anigna . Advised her to continue to walk   COVID-19 Education: The signs and symptoms of COVID-19 were discussed with the patient and how to seek care for testing (follow up with PCP or arrange E-visit).  The importance of social distancing was discussed today.  Time:   Today, I have spent  25  minutes with the patient with telehealth technology discussing the above problems.     Medication Adjustments/Labs and Tests Ordered: Current medicines are reviewed at length with the patient today.  Concerns regarding medicines are outlined above.   Tests Ordered: No orders of the defined types were placed in this encounter.   Medication Changes: No orders of the defined types were placed in this encounter.   Disposition:  Follow up in 4 month(s)  Signed, Mertie Moores, MD  04/23/2019 7:55 AM    [facility]

## 2019-04-23 NOTE — Patient Instructions (Signed)
Medication Instructions:  Your physician recommends that you continue on your current medications as directed. Please refer to the Current Medication list given to you today.  If you need a refill on your cardiac medications before your next appointment, please call your pharmacy.     Lab work: Your physician recommends that you return for lab work on Wednesday August 26 for your fasting lipid profile, liver panel, and basic metabolic panel. You will need to FAST for this appointment - nothing to eat or drink after midnight the night before except water or black coffee. If this date does not work for you, please call to reschedule. We would like to have you complete the lab work approximately 1 week before your appointment.    Testing/Procedures: None Ordered    Follow-Up: Your physician recommends that you return for a follow-up appointment on Thursday September 3 at 9:20 with Dr. Acie Fredrickson

## 2019-04-27 ENCOUNTER — Ambulatory Visit
Admission: RE | Admit: 2019-04-27 | Discharge: 2019-04-27 | Disposition: A | Discharge status: Home / Self Care | Payer: PPO | Source: Ambulatory Visit | Attending: Neurology | Admitting: Neurology

## 2019-04-27 DIAGNOSIS — R55 Syncope and collapse: Secondary | ICD-10-CM

## 2019-05-01 ENCOUNTER — Telehealth: Payer: Self-pay | Admitting: Neurology

## 2019-05-01 ENCOUNTER — Other Ambulatory Visit: Payer: Self-pay

## 2019-05-01 DIAGNOSIS — R930 Abnormal findings on diagnostic imaging of skull and head, not elsewhere classified: Secondary | ICD-10-CM

## 2019-05-01 NOTE — Telephone Encounter (Signed)
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 Discussed this is an incidental finding, she is asymptomatic from this, she states she feels good, no focal/visual symptoms. We discussed doing an MRI brain without contrast to further evaluate CT findings. Also discussed staring a daily aspirin 33m for intracranial small vessel disease, she is hesitant to start ASA and reports a history of ulcers in the past and hiatal hernia. She would like to wait for MRI results first. Discussed that if she has any sudden change in symptoms, to go to ER immediately, patient expressed understanding.   Heather, can you pls order MRI brain without contrast, Dx: abnormal head CT. Thanks!

## 2019-05-01 NOTE — Telephone Encounter (Signed)
Erline Levine called wanting to give stat lab results on this patient. Please call her back at [phone]. Thanks!

## 2019-05-01 NOTE — Telephone Encounter (Signed)
MRI ordered

## 2019-05-14 MED ORDER — CARVEDILOL 3.125 MG PO TABS: 3.1250 mg | ORAL_TABLET | Freq: Two times a day (BID) | ORAL | 11 refills | Status: DC

## 2019-05-23 ENCOUNTER — Telehealth: Payer: Self-pay | Admitting: Internal Medicine

## 2019-05-23 NOTE — Telephone Encounter (Signed)
PT having problems with Raynauds Can wean Coreg to 1/2 tab for 3 days the 1/2 tab for 3 days then off. Try 2.5 amlodipine for BP   May help with raynauds  Can increase dose

## 2019-05-23 NOTE — Telephone Encounter (Signed)
Apology about late reply   But since you had  Lab work done in ED   Would  Be fine and easier   to get lab with cardiology   He is ordering some specific lipid tests .

## 2019-05-31 ENCOUNTER — Ambulatory Visit
Admission: RE | Admit: 2019-05-31 | Discharge: 2019-05-31 | Disposition: A | Discharge status: Home / Self Care | Payer: PPO | Source: Ambulatory Visit | Attending: Neurology | Admitting: Neurology

## 2019-05-31 ENCOUNTER — Other Ambulatory Visit: Payer: Self-pay

## 2019-05-31 DIAGNOSIS — R930 Abnormal findings on diagnostic imaging of skull and head, not elsewhere classified: Secondary | ICD-10-CM

## 2019-05-31 DIAGNOSIS — R55 Syncope and collapse: Secondary | ICD-10-CM | POA: Diagnosis not present

## 2019-06-01 ENCOUNTER — Telehealth: Payer: Self-pay

## 2019-06-01 NOTE — Telephone Encounter (Signed)
Pt called back and was given results from MRI, MRI brain was normal, there was no evidence of stroke, tumor, or bleed

## 2019-06-25 ENCOUNTER — Other Ambulatory Visit: Payer: Self-pay | Admitting: Nurse Practitioner

## 2019-06-25 MED ORDER — TELMISARTAN 20 MG PO TABS: 20.0000 mg | ORAL_TABLET | Freq: Every day | ORAL | 3 refills | Status: DC

## 2019-06-25 NOTE — Telephone Encounter (Signed)
She has mentioned Telmisartan.   I like telmisartan - she can try 20 mg at night Alternatively, she may have better luck with splitting the dose in 1/2 and taking 10 mg BID .     Mertie Moores, MD  06/25/2019 9:04 AM    [place],  [place], [place]  [address] Pager [phone] Phone: [phone]; Fax: [phone]

## 2019-08-01 ENCOUNTER — Other Ambulatory Visit: Payer: Self-pay

## 2019-08-01 ENCOUNTER — Other Ambulatory Visit: Payer: PPO | Admitting: *Deleted

## 2019-08-01 DIAGNOSIS — I2510 Atherosclerotic heart disease of native coronary artery without angina pectoris: Secondary | ICD-10-CM

## 2019-08-01 DIAGNOSIS — I2584 Coronary atherosclerosis due to calcified coronary lesion: Secondary | ICD-10-CM

## 2019-08-01 DIAGNOSIS — E7800 Pure hypercholesterolemia, unspecified: Secondary | ICD-10-CM

## 2019-08-02 LAB — HEPATIC FUNCTION PANEL
ALT: 10 IU/L (ref 0–32)
AST: 14 IU/L (ref 0–40)
Albumin: 4.2 g/dL (ref 3.8–4.8)
Alkaline Phosphatase: 59 IU/L (ref 39–117)
Bilirubin Total: 0.2 mg/dL (ref 0.0–1.2)
Bilirubin, Direct: 0.07 mg/dL (ref 0.00–0.40)
Total Protein: 6.3 g/dL (ref 6.0–8.5)

## 2019-08-02 LAB — NMR LIPOPROF + GRAPH
Cholesterol, Total: 328 mg/dL — ABNORMAL HIGH (ref 100–199)
HDL Particle Number: 27.9 umol/L — ABNORMAL LOW (ref >=30.5)
HDL-C: 64 mg/dL (ref >39)
LDL Particle Number: 2722 nmol/L — ABNORMAL HIGH (ref <1000)
LDL Size: 21.5 nm (ref >20.5)
LDL-C: 246 mg/dL — ABNORMAL HIGH (ref 0–99)
LP-IR Score: <25 (ref <=45)
Small LDL Particle Number: 813 nmol/L — ABNORMAL HIGH (ref <=527)
Triglycerides: 92 mg/dL (ref 0–149)

## 2019-08-02 LAB — APOLIPOPROTEIN B: Apolipoprotein B: 176 mg/dL — ABNORMAL HIGH (ref <90)

## 2019-08-02 LAB — BASIC METABOLIC PANEL
BUN/Creatinine Ratio: 12 (ref 12–28)
BUN: 8 mg/dL (ref 8–27)
CO2: 21 mmol/L (ref 20–29)
Calcium: 9.5 mg/dL (ref 8.7–10.3)
Chloride: 102 mmol/L (ref 96–106)
Creatinine, Ser: 0.66 mg/dL (ref 0.57–1.00)
GFR calc Af Amer: 104 mL/min/{1.73_m2} (ref >59)
GFR calc non Af Amer: 90 mL/min/{1.73_m2} (ref >59)
Glucose: 87 mg/dL (ref 65–99)
Potassium: 5 mmol/L (ref 3.5–5.2)
Sodium: 139 mmol/L (ref 134–144)

## 2019-08-02 LAB — LIPOPROTEIN A (LPA): Lipoprotein (a): 59.1 nmol/L (ref <75.0)

## 2019-08-08 ENCOUNTER — Encounter: Payer: Self-pay | Admitting: Internal Medicine

## 2019-08-08 NOTE — Progress Notes (Signed)
Cardiology Office Note:    Date:  08/09/2019   ID:  Destiny Cruz, DOB [birth year], MRN [id number]  PCP:  Burnis Medin, MD  Cardiologist:  Mertie Moores, MD   Referring MD: Burnis Medin, MD   Chief Complaint  Patient presents with   Hypertension   Hyperlipidemia    Apr 05, 2018   Destiny Cruz is a 68 y.o. female with a hx of HTN for years  Much higher this past year .  Amlodipine 5 mg a day , then increased to 10 mg a day  Has been having PVC s - Coreg 12.5  BID was added  She brought her blood pressure log with her.  A lot of her readings over the past month have been in the 1 50-1 60 range.  With these recent increasing medication doses her blood pressures typically in the 1 30-1 40 range.  Occasionally her blood pressure is well controlled but she still has occasional systolic blood pressures of 160.   No severely blood elevated blood pressure since doubling the amlodipine last week.  Does not eat salt  Has been losing weight on her diet .   She walks 30 minuets a day ,   Is a retired Licensed conveyancer   Father died of a hemorhagic stroke , mother had afib.  No CP , no dyspnea.    Aug. 30, 2019:   And is seen today for follow-up visit.  She has a history of hypertension.  We added hydrochlorthiazide 25  mg a day as well as potassium chloride 10 mEq a day at her last visit.  Her blood pressure is a bit better. On Coreg 6. 25  BID . Walks every day / most days  3-4 miles a day  Still has some occasional episodes of dizziness. Admits that she does not drink enough Has Raynauds syndrome - has symptoms in hot and cold weather Her BP meds seem to be helping this problem   Sept. 3, 2020  Destiny Cruz is seen today for follow up of her HTN. She had lipid levels drawn recently.  Her ApoB level is elevated at 176.  Total cholesterol is 328.  LDL particle number is 2722.  The LDL is 246.  Triglycerides equal 92. Attributes her elevated chol to her Keto diet.   BP log  looks good  Has lost 20 lbs   No cp, no dyspnea Wants to get a coronary calcium score   Past Medical History:  Diagnosis Date   Chicken pox    Duodenal ulcer    clinical dx when younger responded to med and no recurrance no bleeding   Gestational diabetes    Gluten intolerance    possible celiac  see text    History of scarlatina    Hx of allergy    as a child hay fever    Migraines    UTI (lower urinary tract infection)    once     Past Surgical History:  Procedure Laterality Date   CESAREAN SECTION  7106,[id number]    Current Medications: Current Meds  Medication Sig   Cholecalciferol (VITAMIN D-3 PO) Take 4,000 Units by mouth daily.   telmisartan (MICARDIS) 20 MG tablet Take 1 tablet (20 mg total) by mouth daily.     Allergies:   Keflex [cephalexin], Penicillins, and Sulfa antibiotics   Social History   Socioeconomic History   Marital status: Married    Spouse name: Not on file  Number of children: Not on file   Years of education: Not on file   Highest education level: Not on file  Occupational History   Occupation: Retired  Scientist, product/process development strain: Not on file   Food insecurity    Worry: Not on file    Inability: Not on Lexicographer needs    Medical: Not on file    Non-medical: Not on file  Tobacco Use   Smoking status: Never Smoker   Smokeless tobacco: Never Used  Substance and Sexual Activity   Alcohol use: Not Currently    Alcohol/week: 0.0 standard drinks    Comment: 1-2 Glasses of wine per day   Drug use: No   Sexual activity: Yes    Partners: Male  Lifestyle   Physical activity    Days per week: Not on file    Minutes per session: Not on file   Stress: Not on file  Relationships   Social connections    Talks on phone: Not on file    Gets together: Not on file    Attends religious service: Not on file    Active member of club or organization: Not on file    Attends meetings of clubs or  organizations: Not on file    Relationship status: Not on file  Other Topics Concern   Not on file  Social History Narrative   Retired on 10/06/2015   Lives her husband and mother   Masters science degree  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  ROS:   Please see the history of present illness.       EKGs/Labs/Other Studies Reviewed:       EKG:   -Apr 05, 2018:    Sinus bradycardia 55 beats minute.  She has U waves.  Recent Labs: 04/02/2019: Hemoglobin 13.4; Platelets 226 08/01/2019: ALT 10; BUN 8; Creatinine, Ser 0.66; Potassium 5.0; Sodium 139  Recent Lipid Panel    Component Value Date/Time   CHOL 240 (H) 01/26/2018 0750   TRIG 147.0 01/26/2018 0750   HDL 56.20 01/26/2018 0750   CHOLHDL 4 01/26/2018 0750   VLDL 29.4 01/26/2018 0750   LDLCALC 155 (H) 01/26/2018 0750    Physical Exam: Blood pressure 138/74, pulse 73, height 5' (1.524 m), weight 115 lb 6.4 oz (52.3 kg), SpO2 99 %.  GEN:  Well nourished, well developed in no acute distress HEENT: Normal NECK: No JVD; No carotid bruits LYMPHATICS: No lymphadenopathy CARDIAC: RRR   RESPIRATORY:  Clear to auscultation without rales, wheezing or rhonchi  ABDOMEN: pulsatile abdominal aorta  MUSCULOSKELETAL:  No edema; No deformity  SKIN: Warm and dry NEUROLOGIC:  Alert and oriented x 3    ASSESSMENT:    1. Coronary artery calcification   2. Elevated LDL cholesterol level    PLAN:     1.  Hyperlipidemia:  Has markedly elevated chol levels .   She does not want to take a statin.   She has numerous questions about her NMR lipid profile.  I think she would benefit from a consultation with Dr Debara Pickett for further discussion of her lipid profile.   She has evidence of coronary  calcification on a chest CT.  We  will get a true coronary caclium score to help with her decision-making process.  She had requested a coronary calcium score.  HTN -   blood pressure overall is well controlled.  Continue current dose of Micardis 20 mg a day.  2. PVCs -    .  No significant arrhythmias.  Continue current medications  I will see her again in 6 months  Medication Adjustments/Labs and Tests Ordered: Current medicines are reviewed at length with the patient today.  Concerns regarding medicines are outlined above.  Orders Placed This Encounter  Procedures   CT CARDIAC SCORING   AMB Referral to [facility]   No orders of the defined types were placed in this encounter.   Patient Instructions  Medication Instructions:   Your physician recommends that you continue on your current medications as directed. Please refer to the Current Medication list given to you today.  If you need a refill on your cardiac medications before your next appointment, please call your pharmacy.   Lab work: None Ordered  If you have labs (blood work) drawn today and your tests are completely normal, you will receive your results only by:  [place] (if you have MyChart) OR  A paper copy in the mail If you have any lab test that is abnormal or we need to change your treatment, we will call you to review the results.  Testing/Procedures: CT scanning for calcium score, (CAT scanning), is a noninvasive, special x-ray that produces cross-sectional images of the body using x-rays and a computer. CT scans help physicians diagnose and treat medical conditions. For some CT exams, a contrast material is used to enhance visibility in the area of the body being studied. CT scans provide greater clarity and reveal more details than regular x-ray exams.       Follow-Up: At [facility], you and your health needs are our priority.  As part of our continuing mission to  provide you with exceptional heart care, we have created designated Provider Care Teams.  These Care Teams include your primary Cardiologist (physician) and Advanced Practice Providers (APPs -  Physician Assistants and Nurse Practitioners) who all work together to provide you with the care you need, when you need it. You will need a follow up appointment in:  6 months.  Please call our office 2 months in advance to schedule this appointment.  You may see Mertie Moores, MD or one of the following Advanced Practice Providers on your designated Care Team: Richardson Dopp, PA-C [place], [place]  Daune Perch, NP  You have been referred to Dr. Debara Pickett for evaluation of your high cholesterol Coronary Calcium Scan A coronary calcium scan is an imaging test used to look for deposits of calcium and other fatty materials (plaques) in the inner lining of the blood vessels of the heart (coronary arteries). These deposits of calcium and plaques can partly clog and narrow the coronary arteries without producing any symptoms or warning signs. This puts a person at risk for a heart attack. This test can detect these deposits before symptoms develop. Tell a health care provider about:  Any allergies you have.  All medicines you are taking, including vitamins, herbs, eye drops, creams, and over-the-counter medicines.  Any problems you or family members have had with anesthetic medicines.  Any blood disorders you have.  Any surgeries you have had.  Any medical conditions you have.  Whether you are pregnant or may be pregnant. What are the risks? Generally,  this is a safe procedure. However, problems may occur, including:  Harm to a pregnant woman and her unborn baby. This test involves the use of radiation. Radiation exposure can be dangerous to a pregnant woman and her unborn baby. If you are pregnant, you generally should not have this procedure done.  Slight increase in the risk of cancer. This is because  of the radiation involved in the test. What happens before the procedure? No preparation is needed for this procedure. What happens during the procedure?   You will undress and remove any jewelry around your neck or chest.  You will put on a hospital gown.  Sticky electrodes will be placed on your chest. The electrodes will be connected to an electrocardiogram (ECG) machine to record a tracing of the electrical activity of your heart.  A CT scanner will take pictures of your heart. During this time, you will be asked to lie still and hold your breath for 2-3 seconds while a picture of your heart is being taken. The procedure may vary among health care providers and hospitals. What happens after the procedure?  You can get dressed.  You can return to your normal activities.  It is up to you to get the results of your test. Ask your health care provider, or the department that is doing the test, when your results will be ready. Summary  A coronary calcium scan is an imaging test used to look for deposits of calcium and other fatty materials (plaques) in the inner lining of the blood vessels of the heart (coronary arteries).  Generally, this is a safe procedure. Tell your health care provider if you are pregnant or may be pregnant.  No preparation is needed for this procedure.  A CT scanner will take pictures of your heart.  You can return to your normal activities after the scan is done. This information is not intended to replace advice given to you by your health care provider. Make sure you discuss any questions you have with your health care provider. Document Released: 05/20/2008 Document Revised: 11/04/2017 Document Reviewed: 10/11/2016 Elsevier Patient Education  2020 [place], Mertie Moores, MD  08/09/2019 10:28 AM    [place]

## 2019-08-09 ENCOUNTER — Encounter: Payer: Self-pay | Admitting: Cardiovascular Disease

## 2019-08-09 ENCOUNTER — Other Ambulatory Visit: Payer: Self-pay

## 2019-08-09 ENCOUNTER — Ambulatory Visit: Payer: PPO | Admitting: Cardiovascular Disease

## 2019-08-09 VITALS — BP 138/74 | HR 73 | Ht 60.0 in | Wt 115.4 lb

## 2019-08-09 DIAGNOSIS — I2584 Coronary atherosclerosis due to calcified coronary lesion: Secondary | ICD-10-CM

## 2019-08-09 DIAGNOSIS — E7800 Pure hypercholesterolemia, unspecified: Secondary | ICD-10-CM | POA: Diagnosis not present

## 2019-08-09 DIAGNOSIS — I2510 Atherosclerotic heart disease of native coronary artery without angina pectoris: Secondary | ICD-10-CM | POA: Diagnosis not present

## 2019-08-09 DIAGNOSIS — I10 Essential (primary) hypertension: Secondary | ICD-10-CM

## 2019-08-09 DIAGNOSIS — E782 Mixed hyperlipidemia: Secondary | ICD-10-CM | POA: Diagnosis not present

## 2019-08-09 NOTE — Patient Instructions (Signed)
Medication Instructions:   Your physician recommends that you continue on your current medications as directed. Please refer to the Current Medication list given to you today.  If you need a refill on your cardiac medications before your next appointment, please call your pharmacy.   Lab work: None Ordered  If you have labs (blood work) drawn today and your tests are completely normal, you will receive your results only by: [place] Kitchen MyChart Message (if you have MyChart) OR . A paper copy in the mail If you have any lab test that is abnormal or we need to change your treatment, we will call you to review the results.  Testing/Procedures: CT scanning for calcium score, (CAT scanning), is a noninvasive, special x-ray that produces cross-sectional images of the body using x-rays and a computer. CT scans help physicians diagnose and treat medical conditions. For some CT exams, a contrast material is used to enhance visibility in the area of the body being studied. CT scans provide greater clarity and reveal more details than regular x-ray exams.       Follow-Up: At [facility], you and your health needs are our priority.  As part of our continuing mission to provide you with exceptional heart care, we have created designated Provider Care Teams.  These Care Teams include your primary Cardiologist (physician) and Advanced Practice Providers (APPs -  Physician Assistants and Nurse Practitioners) who all work together to provide you with the care you need, when you need it. You will need a follow up appointment in:  6 months.  Please call our office 2 months in advance to schedule this appointment.  You may see Mertie Moores, MD or one of the following Advanced Practice Providers on your designated Care Team: Richardson Dopp, PA-C [place], [place] . Daune Perch, NP  You have been referred to Dr. Debara Pickett for evaluation of your high cholesterol Coronary Calcium Scan A coronary calcium scan is an imaging  test used to look for deposits of calcium and other fatty materials (plaques) in the inner lining of the blood vessels of the heart (coronary arteries). These deposits of calcium and plaques can partly clog and narrow the coronary arteries without producing any symptoms or warning signs. This puts a person at risk for a heart attack. This test can detect these deposits before symptoms develop. Tell a health care provider about:  Any allergies you have.  All medicines you are taking, including vitamins, herbs, eye drops, creams, and over-the-counter medicines.  Any problems you or family members have had with anesthetic medicines.  Any blood disorders you have.  Any surgeries you have had.  Any medical conditions you have.  Whether you are pregnant or may be pregnant. What are the risks? Generally, this is a safe procedure. However, problems may occur, including:  Harm to a pregnant woman and her unborn baby. This test involves the use of radiation. Radiation exposure can be dangerous to a pregnant woman and her unborn baby. If you are pregnant, you generally should not have this procedure done.  Slight increase in the risk of cancer. This is because of the radiation involved in the test. What happens before the procedure? No preparation is needed for this procedure. What happens during the procedure?   You will undress and remove any jewelry around your neck or chest.  You will put on a hospital gown.  Sticky electrodes will be placed on your chest. The electrodes will be connected to an electrocardiogram (ECG) machine to  record a tracing of the electrical activity of your heart.  A CT scanner will take pictures of your heart. During this time, you will be asked to lie still and hold your breath for 2-3 seconds while a picture of your heart is being taken. The procedure may vary among health care providers and hospitals. What happens after the procedure?  You can get dressed.   You can return to your normal activities.  It is up to you to get the results of your test. Ask your health care provider, or the department that is doing the test, when your results will be ready. Summary  A coronary calcium scan is an imaging test used to look for deposits of calcium and other fatty materials (plaques) in the inner lining of the blood vessels of the heart (coronary arteries).  Generally, this is a safe procedure. Tell your health care provider if you are pregnant or may be pregnant.  No preparation is needed for this procedure.  A CT scanner will take pictures of your heart.  You can return to your normal activities after the scan is done. This information is not intended to replace advice given to you by your health care provider. Make sure you discuss any questions you have with your health care provider. Document Released: 05/20/2008 Document Revised: 11/04/2017 Document Reviewed: 10/11/2016 Elsevier Patient Education  2020 [employer].

## 2019-09-13 ENCOUNTER — Ambulatory Visit (INDEPENDENT_AMBULATORY_CARE_PROVIDER_SITE_OTHER)
Admission: RE | Admit: 2019-09-13 | Discharge: 2019-09-13 | Disposition: A | Discharge status: Home / Self Care | Payer: Self-pay | Source: Ambulatory Visit | Attending: Cardiovascular Disease | Admitting: Cardiovascular Disease

## 2019-09-13 ENCOUNTER — Other Ambulatory Visit: Payer: Self-pay

## 2019-09-13 DIAGNOSIS — I2510 Atherosclerotic heart disease of native coronary artery without angina pectoris: Secondary | ICD-10-CM

## 2019-09-13 DIAGNOSIS — I2584 Coronary atherosclerosis due to calcified coronary lesion: Secondary | ICD-10-CM

## 2019-09-13 DIAGNOSIS — E7800 Pure hypercholesterolemia, unspecified: Secondary | ICD-10-CM

## 2019-10-10 ENCOUNTER — Other Ambulatory Visit: Payer: Self-pay

## 2019-10-10 ENCOUNTER — Ambulatory Visit: Payer: PPO | Admitting: Internal Medicine

## 2019-10-10 ENCOUNTER — Encounter: Payer: Self-pay | Admitting: Internal Medicine

## 2019-10-10 VITALS — BP 138/72 | HR 64 | Temp 96.6°F | Ht 60.0 in | Wt 112.0 lb

## 2019-10-10 DIAGNOSIS — I10 Essential (primary) hypertension: Secondary | ICD-10-CM | POA: Diagnosis not present

## 2019-10-10 DIAGNOSIS — E782 Mixed hyperlipidemia: Secondary | ICD-10-CM

## 2019-10-10 DIAGNOSIS — I2584 Coronary atherosclerosis due to calcified coronary lesion: Secondary | ICD-10-CM | POA: Diagnosis not present

## 2019-10-10 DIAGNOSIS — I2510 Atherosclerotic heart disease of native coronary artery without angina pectoris: Secondary | ICD-10-CM

## 2019-10-10 MED ORDER — ROSUVASTATIN CALCIUM 20 MG PO TABS: 20.0000 mg | ORAL_TABLET | Freq: Every day | ORAL | 3 refills | Status: DC

## 2019-10-10 NOTE — Patient Instructions (Signed)
Medication Instructions:  START crestor 39m daily Continue other current medications *If you need a refill on your cardiac medications before your next appointment, please call your pharmacy*  Lab Work: FASTING lab work in 3 months to check cholesterol  If you have labs (blood work) drawn today and your tests are completely normal, you will receive your results only by: .Marland KitchenMyChart Message (if you have MyChart) OR . A paper copy in the mail If you have any lab test that is abnormal or we need to change your treatment, we will call you to review the results.  Testing/Procedures: NONE  Follow-Up: [facility] and your [facility] needs are our priority.  As part of our continuing mission to provide you with exceptional heart care, we have created designated Provider Care Teams.  These Care Teams include your primary Cardiologist (physician) and Advanced Practice Providers (APPs -  Physician Assistants and Nurse Practitioners) who all work together to provide you with the care you need, when you need it.  Your next appointment:   Dr. HDebara Pickettrecommends that you schedule a [place]. Please have fasting blood work about 1 week prior to this visit and he will review the blood work results with you at your appointment.   The format for your next appointment:   Either In Person or Virtual

## 2019-10-10 NOTE — Progress Notes (Signed)
LIPID CLINIC CONSULT NOTE  Chief Complaint:  Manage dyslipidemia  Primary Care Physician: Destiny Medin, MD  Primary Cardiologist:  Destiny Moores, MD  HPI:  Destiny Cruz is a 68 y.o. female who is being seen today for the evaluation of dyslipidemia at the request of Nahser, Wonda Cheng, MD. This is a pleasant 68 year old female who is a wife of a patient of mine.  She is followed by Destiny Cruz and was referred for management of dyslipidemia.  She has a history of recent hypertension now managed on Micardis.  She does have longstanding dyslipidemia.  In fact she brought in a complex graft today which indicates her lipids that have been tracked back to 2002.  She has had persistently elevated LDL-C the lowest being 131 but is high as recently 246 as of August 2020.  Her total cholesterol has been persistently over 200 for years.  Recently she had coronary artery calcium scoring which was elevated at 114, intermediate risk and the 76 percentile based on age and sex matched controls.  She is completely asymptomatic from a cardiac standpoint.  Says she eats a healthy diet which is according to [facility] guidelines will not make any dietary modifications.  Recently she has lost weight and she seems to be comfortable with her current weight which is in range and acceptable BMI of 21.  Physical activity is minimal.  There is a family history of hemorrhagic stroke as well as heart disease which is concerning to her.  And the reason she presented was for further evaluation and management recommendations.  Most recently her lipid profile which was a lipid NMR showed a total cholesterol of 328, triglycerides 92, HDL 64 and LDL-C of 246.  Her LDL-P was 2722 nmol/L and a LPA was normal at 59.  APO B was significantly elevated 176 with an increase in small LDL-P of greater than 800.  Overall a significantly abnormal lipid profile suggesting increased cardiovascular risk.  PMHx:  Past Medical History:   Diagnosis Date  . Chicken pox   . Duodenal ulcer    clinical dx when younger responded to med and no recurrance no bleeding  . Gestational diabetes   . Gluten intolerance    possible celiac  see text   . History of scarlatina   . Hx of allergy    as a child hay fever   . Migraines   . UTI (lower urinary tract infection)    once     Past Surgical History:  Procedure Laterality Date  . CESAREAN SECTION  [id number]    FAMHx:  Family History  Problem Relation Age of Onset  . Colon cancer Father   . Stroke Father   . Rheum arthritis Mother   . Thyroid disease Daughter        partial thyroidectomy   . Celiac disease Son        probable    SOCHx:   reports that she has never smoked. She has never used smokeless tobacco. She reports previous alcohol use. She reports that she does not use drugs.  ALLERGIES:  Allergies  Allergen Reactions  . Keflex [Cephalexin] Hives  . Penicillins Hives  . Sulfa Antibiotics Hives    ROS: Pertinent items noted in HPI and remainder of comprehensive ROS otherwise negative.  HOME MEDS: Current Outpatient Medications on File Prior to Visit  Medication Sig Dispense Refill  . Cholecalciferol (VITAMIN D-3 PO) Take 4,000 Units by mouth daily.    Marland Kitchen  telmisartan (MICARDIS) 20 MG tablet Take 1 tablet (20 mg total) by mouth daily. 90 tablet 3   No current facility-administered medications on file prior to visit.     LABS/IMAGING: No results found for this or any previous visit (from the past 48 hour(s)). No results found.  LIPID PANEL:    Component Value Date/Time   CHOL 240 (H) 01/26/2018 0750   TRIG 147.0 01/26/2018 0750   HDL 56.20 01/26/2018 0750   CHOLHDL 4 01/26/2018 0750   VLDL 29.4 01/26/2018 0750   LDLCALC 155 (H) 01/26/2018 0750    WEIGHTS: Wt Readings from Last 3 Encounters:  10/10/19 112 lb (50.8 kg)  08/09/19 115 lb 6.4 oz (52.3 kg)  04/23/19 122 lb 12.8 oz (55.7 kg)    VITALS: BP 138/72 (BP Location: Left Arm,  Patient Position: Sitting, Cuff Size: Normal)   Pulse 64   Temp (!) 96.6 F (35.9 C)   Ht 5' (1.524 m)   Wt 112 lb (50.8 kg)   BMI 21.87 kg/m   EXAM: Deferred  EKG: Deferred  ASSESSMENT: 1. Mixed dyslipidemia, goal LDL less than 70 2. Elevated CAC score 114 (2020) 3. Hypertension  PLAN: 1.   [name] has a significant elevated LDL cholesterol with a high particle count.  She does have coronary artery calcification although is at only 55 percentile given age and sex matched controls.  I agree that she should be on therapy but she has concerns about the statin intolerance.  She would like to go to PCSK9 inhibitor however I informed her that would not be possible without potentially failing statin therapy first.  I reminded her that clinical trials and PCSK9 inhibitors typically involve patients who are on maximally tolerated statin therapy.  I encouraged her to try statin therapy as she may tolerate it and get significant lipid reduction.  Per current guidelines would recommend 50% reduction with high potency statin however we will choose the lower of the high potency statins rosuvastatin 11m to see if this is better tolerated.  I suspect though she would not meet target and may need additional therapy such as PCSK9 inhibitor, ezetimibe, Nexletol or other options that are available.  We will plan to repeat lipid profile in 3 to 4 months.  She declined dietary recommendations.  Thanks for the kind referral.  Destiny Casino MD, FACC, FWesthamptonDirector of the Advanced Lipid Disorders &  Cardiovascular Risk Reduction Clinic Diplomate of the American Board of Clinical Lipidology Attending Cardiologist  Direct Dial: [phone] Fax: [phone] Website:  [email or web address] 10/10/2019, 9:57 AM

## 2020-01-01 ENCOUNTER — Ambulatory Visit: Payer: PPO

## 2020-01-01 DIAGNOSIS — E782 Mixed hyperlipidemia: Secondary | ICD-10-CM | POA: Diagnosis not present

## 2020-01-02 LAB — NMR, LIPOPROFILE
Cholesterol, Total: 161 mg/dL (ref 100–199)
HDL Particle Number: 35.4 umol/L (ref >=30.5)
HDL-C: 64 mg/dL (ref >39)
LDL Particle Number: 836 nmol/L (ref <1000)
LDL Size: 20.5 nm — ABNORMAL LOW (ref >20.5)
LDL-C (NIH Calc): 81 mg/dL (ref 0–99)
LP-IR Score: <25 (ref <=45)
Small LDL Particle Number: 316 nmol/L (ref <=527)
Triglycerides: 83 mg/dL (ref 0–149)

## 2020-01-10 ENCOUNTER — Ambulatory Visit: Payer: PPO | Attending: Internal Medicine

## 2020-01-10 DIAGNOSIS — Z23 Encounter for immunization: Secondary | ICD-10-CM | POA: Insufficient documentation

## 2020-01-10 NOTE — Progress Notes (Signed)
   Covid-19 Vaccination Clinic  Name:  Destiny Cruz    MRN: [id number] DOB: [birth year]  01/10/2020  [name] was observed post Covid-19 immunization for 15 minutes without incidence. She was provided with Vaccine Information Sheet and instruction to access the V-Safe system.   [name] was instructed to call 911 with any severe reactions post vaccine: Marland Kitchen Difficulty breathing  . Swelling of your face and throat  . A fast heartbeat  . A bad rash all over your body  . Dizziness and weakness    Immunizations Administered    Name Date Dose VIS Date Route   Pfizer COVID-19 Vaccine 01/10/2020  5:59 PM 0.3 mL 11/16/2019 Intramuscular   Manufacturer: [place]   Lot: [id number]   [place]: [address]-3

## 2020-01-11 ENCOUNTER — Other Ambulatory Visit: Payer: Self-pay

## 2020-01-11 ENCOUNTER — Encounter: Payer: Self-pay | Admitting: Internal Medicine

## 2020-01-11 ENCOUNTER — Ambulatory Visit: Payer: PPO | Admitting: Internal Medicine

## 2020-01-11 VITALS — BP 154/76 | HR 65 | Ht 60.0 in | Wt 107.2 lb

## 2020-01-11 DIAGNOSIS — I2510 Atherosclerotic heart disease of native coronary artery without angina pectoris: Secondary | ICD-10-CM

## 2020-01-11 DIAGNOSIS — E782 Mixed hyperlipidemia: Secondary | ICD-10-CM | POA: Diagnosis not present

## 2020-01-11 DIAGNOSIS — I2584 Coronary atherosclerosis due to calcified coronary lesion: Secondary | ICD-10-CM

## 2020-01-11 NOTE — Patient Instructions (Signed)
Medication Instructions:  Your physician recommends that you continue on your current medications as directed. Please refer to the Current Medication list given to you today.  *If you need a refill on your cardiac medications before your next appointment, please call your pharmacy*  Lab Work: FASTING lab work in 1 year (before next lipid clinic visit)   If you have labs (blood work) drawn today and your tests are completely normal, you will receive your results only by: Marland Kitchen MyChart Message (if you have MyChart) OR . A paper copy in the mail If you have any lab test that is abnormal or we need to change your treatment, we will call you to review the results.  Testing/Procedures: NONE  Follow-Up: At [facility], you and your health needs are our priority.  As part of our continuing mission to provide you with exceptional heart care, we have created designated Provider Care Teams.  These Care Teams include your primary Cardiologist (physician) and Advanced Practice Providers (APPs -  Physician Assistants and Nurse Practitioners) who all work together to provide you with the care you need, when you need it.  Your next appointment:   12 month(s) - lipid clinic  The format for your next appointment:   Either In Person or Virtual  Provider:   K. Mali Hilty, MD  Other Instructions

## 2020-01-11 NOTE — Progress Notes (Signed)
LIPID CLINIC CONSULT NOTE  Chief Complaint:  Follow-up dyslipidemia  Primary Care Physician: Burnis Medin, MD  Primary Cardiologist:  Mertie Moores, MD  HPI:  Destiny Cruz is a 69 y.o. female who is being seen today for the evaluation of dyslipidemia at the request of Panosh, Standley Brooking, MD. This is a pleasant 69 year old female who is a wife of a patient of mine.  She is followed by Dr. Acie Fredrickson and was referred for management of dyslipidemia.  She has a history of recent hypertension now managed on Micardis.  She does have longstanding dyslipidemia.  In fact she brought in a complex graft today which indicates her lipids that have been tracked back to 2002.  She has had persistently elevated LDL-C the lowest being 131 but is high as recently 246 as of August 2020.  Her total cholesterol has been persistently over 200 for years.  Recently she had coronary artery calcium scoring which was elevated at 114, intermediate risk and the 76 percentile based on age and sex matched controls.  She is completely asymptomatic from a cardiac standpoint.  Says she eats a healthy diet which is according to [facility] guidelines will not make any dietary modifications.  Recently she has lost weight and she seems to be comfortable with her current weight which is in range and acceptable BMI of 21.  Physical activity is minimal.  There is a family history of hemorrhagic stroke as well as heart disease which is concerning to her.  And the reason she presented was for further evaluation and management recommendations.  Most recently her lipid profile which was a lipid NMR showed a total cholesterol of 328, triglycerides 92, HDL 64 and LDL-C of 246.  Her LDL-P was 2722 nmol/L and a LPA was normal at 59.  APO B was significantly elevated 176 with an increase in small LDL-P of greater than 800.  Overall a significantly abnormal lipid profile suggesting increased cardiovascular risk.  01/11/2020  Destiny Cruz returns  today for follow-up.  Overall she is doing extremely well.  She has had significant reduction in her lipids on 20 mg of rosuvastatin but is also lost a lot of weight and changed her diet significantly.  She is down to 107 pounds today.  Her lipid NMR previously showed a LDL-P of 2723, now reduced to 836 particles.  LDL-C is now 81, HDL-C 64, small LDL-P is 316, triglycerides 83.  Overall marked improvement in her lipids and very near goal.  Best overall she is tolerating the statin without any side effects.  PMHx:  Past Medical History:  Diagnosis Date  . Chicken pox   . Duodenal ulcer    clinical dx when younger responded to med and no recurrance no bleeding  . Gestational diabetes   . Gluten intolerance    possible celiac  see text   . History of scarlatina   . Hx of allergy    as a child hay fever   . Migraines   . UTI (lower urinary tract infection)    once     Past Surgical History:  Procedure Laterality Date  . CESAREAN SECTION  [id number]    FAMHx:  Family History  Problem Relation Age of Onset  . Colon cancer Father   . Stroke Father   . Rheum arthritis Mother   . Thyroid disease Daughter        partial thyroidectomy   . Celiac disease Son  probable    SOCHx:   reports that she has never smoked. She has never used smokeless tobacco. She reports previous alcohol use. She reports that she does not use drugs.  ALLERGIES:  Allergies  Allergen Reactions  . Keflex [Cephalexin] Hives  . Penicillins Hives  . Sulfa Antibiotics Hives    ROS: Pertinent items noted in HPI and remainder of comprehensive ROS otherwise negative.  HOME MEDS: Current Outpatient Medications on File Prior to Visit  Medication Sig Dispense Refill  . Cholecalciferol (VITAMIN D-3 PO) Take 4,000 Units by mouth daily.    . rosuvastatin (CRESTOR) 20 MG tablet Take 1 tablet (20 mg total) by mouth daily. 90 tablet 3  . telmisartan (MICARDIS) 20 MG tablet Take 1 tablet (20 mg total) by mouth  daily. 90 tablet 3   No current facility-administered medications on file prior to visit.    LABS/IMAGING: No results found for this or any previous visit (from the past 48 hour(s)). No results found.  LIPID PANEL:    Component Value Date/Time   CHOL 240 (H) 01/26/2018 0750   TRIG 147.0 01/26/2018 0750   HDL 56.20 01/26/2018 0750   CHOLHDL 4 01/26/2018 0750   VLDL 29.4 01/26/2018 0750   LDLCALC 155 (H) 01/26/2018 0750    WEIGHTS: Wt Readings from Last 3 Encounters:  01/11/20 107 lb 3.2 oz (48.6 kg)  10/10/19 112 lb (50.8 kg)  08/09/19 115 lb 6.4 oz (52.3 kg)    VITALS: BP (!) 154/76   Pulse 65   Ht 5' (1.524 m)   Wt 107 lb 3.2 oz (48.6 kg)   SpO2 100%   BMI 20.94 kg/m   EXAM: Deferred  EKG: Deferred  ASSESSMENT: 1. Mixed dyslipidemia, goal LDL less than 70 2. Elevated CAC score 114 (2020) 3. Hypertension  PLAN: 1.   Destiny Cruz has had a significant reduction in her dyslipidemia.  Her particle numbers are now at goal although calculated LDL is slightly above target, although she has had greater than 50% reduction in her LDL which meets current guidelines.  She is made major dietary changes and continues to do that and improve her activity.  I would not adjust her medicine further now.  I expect her lipids to improve even further.  Blood pressure was elevated today however she said at home was 947 systolic.  This is generally been well controlled.  Plan follow-up with me annually or sooner as necessary.  Destiny Casino, MD, [facility], [place] Director of the Advanced Lipid Disorders &  Cardiovascular Risk Reduction Clinic Diplomate of the American Board of Clinical Lipidology Attending Cardiologist  Direct Dial: [phone]  Fax: [phone]  Website:  [email or web address] 01/11/2020, 9:26 AM

## 2020-01-12 ENCOUNTER — Ambulatory Visit: Payer: PPO

## 2020-01-23 DIAGNOSIS — L249 Irritant contact dermatitis, unspecified cause: Secondary | ICD-10-CM | POA: Diagnosis not present

## 2020-01-23 DIAGNOSIS — L814 Other melanin hyperpigmentation: Secondary | ICD-10-CM | POA: Diagnosis not present

## 2020-02-05 ENCOUNTER — Ambulatory Visit: Payer: PPO | Attending: Internal Medicine

## 2020-02-05 DIAGNOSIS — Z23 Encounter for immunization: Secondary | ICD-10-CM

## 2020-02-05 NOTE — Progress Notes (Signed)
   Covid-19 Vaccination Clinic  Name:  Destiny Cruz    MRN: [id number] DOB: Oct 24, 1951  02/05/2020  Destiny Cruz was observed post Covid-19 immunization for 15 minutes without incident. She was provided with Vaccine Information Sheet and instruction to access the V-Safe system.   Destiny Cruz was instructed to call 911 with any severe reactions post vaccine: Marland Kitchen Difficulty breathing  . Swelling of face and throat  . A fast heartbeat  . A bad rash all over body  . Dizziness and weakness   Immunizations Administered    Name Date Dose VIS Date Route   Pfizer COVID-19 Vaccine 02/05/2020  8:32 AM 0.3 mL 11/16/2019 Intramuscular   Manufacturer: [place]   Lot: [id number]   [place]: [address]-5

## 2020-02-06 ENCOUNTER — Ambulatory Visit: Payer: PPO | Admitting: Cardiovascular Disease

## 2020-02-11 NOTE — Progress Notes (Signed)
Cardiology Office Note:    Date:  02/12/2020   ID:  Nicholes Calamity, DOB 12/08/1950, MRN [id number]  PCP:  Burnis Medin, MD  Cardiologist:  Mertie Moores, MD   Referring MD: Burnis Medin, MD   No chief complaint on file.   Apr 05, 2018   Destiny Cruz is a 69 y.o. female with a hx of HTN for years  Much higher this past year .  Amlodipine 5 mg a day , then increased to 10 mg a day  Has been having PVC s - Coreg 12.5  BID was added  She brought her blood pressure log with her.  A lot of her readings over the past month have been in the 1 50-1 60 range.  With these recent increasing medication doses her blood pressures typically in the 1 30-1 40 range.  Occasionally her blood pressure is well controlled but she still has occasional systolic blood pressures of 160.   No severely blood elevated blood pressure since doubling the amlodipine last week.  Does not eat salt  Has been losing weight on her diet .   She walks 30 minuets a day ,   Is a retired Licensed conveyancer   Father died of a hemorhagic stroke , mother had afib.  No CP , no dyspnea.    Aug. 30, 2019:   And is seen today for follow-up visit.  She has a history of hypertension.  We added hydrochlorthiazide 25  mg a day as well as potassium chloride 10 mEq a day at her last visit.  Her blood pressure is a bit better. On Coreg 6. 25  BID . Walks every day / most days  3-4 miles a day  Still has some occasional episodes of dizziness. Admits that she does not drink enough Has Raynauds syndrome - has symptoms in hot and cold weather Her BP meds seem to be helping this problem   Sept. 3, 2020  Destiny Cruz is seen today for follow up of her HTN. She had lipid levels drawn recently.  Her ApoB level is elevated at 176.  Total cholesterol is 328.  LDL particle number is 2722.  The LDL is 246.  Triglycerides equal 92. Attributes her elevated chol to her Keto diet.   BP log looks good  Has lost 20 lbs   No cp, no  dyspnea Wants to get a coronary calcium score   February 12, 2020  Destiny Cruz is seen for follow up of her hyperlipidemia and HTN.  She has been followed by Dr. Debara Pickett for her HLD. Coronary calcium score  (Oct. 8, 2020)  of 114. This was 76th percentile for age and sex matched control. She will be following up with Dr. Debara Pickett annually. BP has been well controlled  Has lost 10 lbs since her last office visit in Sept.  Exercising regularly .  Raynauds has been bothering her  PVcs have resolved.  Overall she is feeling well  She had an episode of tachycardia -  ( has a hiatal hernia possibly a reaction to eating full fat dairy )   Past Medical History:  Diagnosis Date  . Chicken pox   . Duodenal ulcer    clinical dx when younger responded to med and no recurrance no bleeding  . Gestational diabetes   . Gluten intolerance    possible celiac  see text   . History of scarlatina   . Hx of allergy    as a  child hay fever   . Migraines   . UTI (lower urinary tract infection)    once     Past Surgical History:  Procedure Laterality Date  . CESAREAN SECTION  [id number]    Current Medications: Current Meds  Medication Sig  . Cholecalciferol (VITAMIN D-3 PO) Take 4,000 Units by mouth daily.  . rosuvastatin (CRESTOR) 20 MG tablet Take 1 tablet (20 mg total) by mouth daily.  Marland Kitchen telmisartan (MICARDIS) 20 MG tablet Take 1 tablet (20 mg total) by mouth daily.     Allergies:   Keflex [cephalexin], Penicillins, and Sulfa antibiotics   Social History   Socioeconomic History  . Marital status: Married    Spouse name: Not on file  . Number of children: Not on file  . Years of education: Not on file  . Highest education level: Not on file  Occupational History  . Occupation: Retired  Tobacco Use  . Smoking status: Never Smoker  . Smokeless tobacco: Never Used  Substance and Sexual Activity  . Alcohol use: Not Currently    Alcohol/week: 0.0 standard drinks    Comment: 1-2 Glasses of wine per  day  . Drug use: No  . Sexual activity: Yes    Partners: Male  Other Topics Concern  . 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 Difficulty of Paying Living Expenses: Not on file  Food Insecurity:   . Worried About Charity fundraiser in the Last Year: Not on file  . Ran Out of Food in the Last Year: Not on file  Transportation Needs:   . Lack of Transportation (Medical): Not on file  . Lack of Transportation (Non-Medical): Not on file  Physical Activity:   . Days of Exercise per Week: Not on file  . Minutes of Exercise per Session: Not on file  Stress:   . Feeling of Stress : Not on file  Social Connections:   . Frequency of Communication with Friends and Family: Not on file  . Frequency of Social Gatherings with Friends and Family: Not on file  . Attends Religious Services: Not on file  . Active Member of Clubs or Organizations: Not on file  . Attends Archivist Meetings: Not on file  . Marital Status: Not on file     Family History: The patient's family history includes Celiac disease in her son; Colon cancer in her father; Rheum arthritis in her mother; Stroke in her father; Thyroid disease in her daughter.  ROS:   Please see the history of present illness.       EKGs/Labs/Other Studies Reviewed:     EKG:   February 12, 2020: Sinus bradycardia 50.  No ST or T wave changes.  Recent Labs: 04/02/2019: Hemoglobin 13.4; Platelets 226 08/01/2019: ALT 10; BUN 8; Creatinine, Ser 0.66; Potassium 5.0; Sodium 139  Recent Lipid Panel    Component Value Date/Time   CHOL 240 (H) 01/26/2018 0750   TRIG 147.0 01/26/2018 0750   HDL 56.20 01/26/2018 0750   CHOLHDL 4 01/26/2018 0750   VLDL 29.4 01/26/2018 0750   LDLCALC 155 (H) 01/26/2018 0750     Physical Exam: Blood pressure (!) 142/60, pulse (!) 59, height 5' (1.524 m), weight  105 lb 8 oz (47.9 kg), SpO2 99 %.  GEN:  Well nourished, well developed in no acute distress HEENT: Normal NECK: No JVD; No carotid bruits LYMPHATICS: No lymphadenopathy CARDIAC: RRR RESPIRATORY:  Clear to auscultation without rales, wheezing or rhonchi  ABDOMEN: Soft, non-tender, non-distended MUSCULOSKELETAL:  No edema; No deformity  SKIN: Warm and dry NEUROLOGIC:  Alert and oriented x 3    ASSESSMENT:    1. Essential hypertension   2. Elevated LDL cholesterol level   3. Coronary artery calcification   4. PVC (premature ventricular contraction)    PLAN:     1.  Hyperlipidemia:  Lipids have improved significantly .   She is being followed by Dr. Debara Pickett for this   HTN -   BP has been well controlled.   She has lost 33 lbs over the past year or so   2. PVCs -    wel controlled.   I offered to give her propranolol if she has more episodes of CP   Will see her in 1 year.   Medication Adjustments/Labs and Tests Ordered: Current medicines are reviewed at length with the patient today.  Concerns regarding medicines are outlined above.  Orders Placed This Encounter  Procedures  . EKG 12-Lead   No orders of the defined types were placed in this encounter.    Patient Instructions  Medication Instructions:  Your physician recommends that you continue on your current medications as directed. Please refer to the Current Medication list given to you today.  *If you need a refill on your cardiac medications before your next appointment, please call your pharmacy*   Lab Work: None Ordered If you have labs (blood work) drawn today and your tests are completely normal, you will receive your results only by: Marland Kitchen MyChart Message (if you have MyChart) OR . A paper copy in the mail If you have any lab test that is abnormal or we need to change your treatment, we will call you to review the  results.   Testing/Procedures: None Ordered   Follow-Up: At [facility], you and your health needs are our priority.  As part of our continuing mission to provide you with exceptional heart care, we have created designated Provider Care Teams.  These Care Teams include your primary Cardiologist (physician) and Advanced Practice Providers (APPs -  Physician Assistants and Nurse Practitioners) who all work together to provide you with the care you need, when you need it.   Your next appointment:   1 year(s)  The format for your next appointment:   In Person  Provider:   You may see Mertie Moores, MD or one of the following Advanced Practice Providers on your designated Care Team:    Richardson Dopp, PA-C  Vin Vardaman, [place]  Daune Perch, [place]        Signed, Mertie Moores, MD  02/12/2020 6:07 PM    [place]

## 2020-02-12 ENCOUNTER — Encounter: Payer: Self-pay | Admitting: Cardiovascular Disease

## 2020-02-12 ENCOUNTER — Ambulatory Visit: Payer: PPO | Admitting: Cardiovascular Disease

## 2020-02-12 ENCOUNTER — Other Ambulatory Visit: Payer: Self-pay

## 2020-02-12 VITALS — BP 142/60 | HR 59 | Ht 60.0 in | Wt 105.5 lb

## 2020-02-12 DIAGNOSIS — I2584 Coronary atherosclerosis due to calcified coronary lesion: Secondary | ICD-10-CM | POA: Diagnosis not present

## 2020-02-12 DIAGNOSIS — I493 Ventricular premature depolarization: Secondary | ICD-10-CM

## 2020-02-12 DIAGNOSIS — E7800 Pure hypercholesterolemia, unspecified: Secondary | ICD-10-CM

## 2020-02-12 DIAGNOSIS — E782 Mixed hyperlipidemia: Secondary | ICD-10-CM

## 2020-02-12 DIAGNOSIS — I10 Essential (primary) hypertension: Secondary | ICD-10-CM | POA: Diagnosis not present

## 2020-02-12 DIAGNOSIS — I2510 Atherosclerotic heart disease of native coronary artery without angina pectoris: Secondary | ICD-10-CM | POA: Diagnosis not present

## 2020-02-12 NOTE — Patient Instructions (Signed)
Medication Instructions:  Your physician recommends that you continue on your current medications as directed. Please refer to the Current Medication list given to you today.  *If you need a refill on your cardiac medications before your next appointment, please call your pharmacy*   Lab Work: None Ordered If you have labs (blood work) drawn today and your tests are completely normal, you will receive your results only by: Marland Kitchen MyChart Message (if you have MyChart) OR . A paper copy in the mail If you have any lab test that is abnormal or we need to change your treatment, we will call you to review the results.   Testing/Procedures: None Ordered   Follow-Up: At [facility], you and your health needs are our priority.  As part of our continuing mission to provide you with exceptional heart care, we have created designated Provider Care Teams.  These Care Teams include your primary Cardiologist (physician) and Advanced Practice Providers (APPs -  Physician Assistants and Nurse Practitioners) who all work together to provide you with the care you need, when you need it.   Your next appointment:   1 year(s)  The format for your next appointment:   In Person  Provider:   You may see Mertie Moores, MD or one of the following Advanced Practice Providers on your designated Care Team:    Richardson Dopp, PA-C  [place], [place]  Daune Perch, [place]

## 2020-02-18 MED ORDER — AMLODIPINE BESYLATE 2.5 MG PO TABS: 2.5000 mg | ORAL_TABLET | Freq: Every day | ORAL | 3 refills | Status: DC

## 2020-04-01 ENCOUNTER — Other Ambulatory Visit: Payer: Self-pay | Admitting: Internal Medicine

## 2020-04-01 DIAGNOSIS — Z1231 Encounter for screening mammogram for malignant neoplasm of breast: Secondary | ICD-10-CM

## 2020-04-03 ENCOUNTER — Ambulatory Visit
Admission: RE | Admit: 2020-04-03 | Discharge: 2020-04-03 | Disposition: A | Discharge status: Home / Self Care | Payer: PPO | Source: Ambulatory Visit | Attending: Internal Medicine | Admitting: Internal Medicine

## 2020-04-03 ENCOUNTER — Other Ambulatory Visit: Payer: Self-pay

## 2020-04-03 DIAGNOSIS — Z1231 Encounter for screening mammogram for malignant neoplasm of breast: Secondary | ICD-10-CM | POA: Diagnosis not present

## 2020-08-19 ENCOUNTER — Other Ambulatory Visit: Payer: Self-pay

## 2020-08-20 ENCOUNTER — Ambulatory Visit (INDEPENDENT_AMBULATORY_CARE_PROVIDER_SITE_OTHER): Payer: PPO | Admitting: Internal Medicine

## 2020-08-20 ENCOUNTER — Encounter: Payer: Self-pay | Admitting: Internal Medicine

## 2020-08-20 VITALS — BP 110/66 | HR 50 | Temp 97.8°F | Ht 60.0 in | Wt 103.0 lb

## 2020-08-20 DIAGNOSIS — R195 Other fecal abnormalities: Secondary | ICD-10-CM | POA: Diagnosis not present

## 2020-08-20 DIAGNOSIS — E782 Mixed hyperlipidemia: Secondary | ICD-10-CM | POA: Diagnosis not present

## 2020-08-20 DIAGNOSIS — Z5320 Procedure and treatment not carried out because of patient's decision for unspecified reasons: Secondary | ICD-10-CM | POA: Diagnosis not present

## 2020-08-20 DIAGNOSIS — H6121 Impacted cerumen, right ear: Secondary | ICD-10-CM | POA: Diagnosis not present

## 2020-08-20 NOTE — Progress Notes (Signed)
Chief Complaint  Patient presents with  . Ear Fullness    Right ear is more full than the left    HPI: Destiny Cruz 69 y.o. come in for   Right ear in past week full.  Has hx of wax impaction   BP  In control.  Statin also sees  Cards doing well better winde lost 30 #  Not changed mind about colon cancer .   ROS: See pertinent positives and negatives per HPI.  Past Medical History:  Diagnosis Date  . Chicken pox   . Duodenal ulcer    clinical dx when younger responded to med and no recurrance no bleeding  . Gestational diabetes   . Gluten intolerance    possible celiac  see text   . History of scarlatina   . Hx of allergy    as a child hay fever   . Migraines   . UTI (lower urinary tract infection)    once     Family History  Problem Relation Age of Onset  . Colon cancer Father   . Stroke Father   . Rheum arthritis Mother   . Thyroid disease Daughter        partial thyroidectomy   . Celiac disease Son        probable    Social History   Socioeconomic History  . Marital status: Married    Spouse name: Not on file  . Number of children: Not on file  . Years of education: Not on file  . Highest education level: Not on file  Occupational History  . Occupation: Retired  Tobacco Use  . Smoking status: Never Smoker  . Smokeless tobacco: Never Used  Vaping Use  . Vaping Use: Never used  Substance and Sexual Activity  . Alcohol use: Not Currently    Alcohol/week: 0.0 standard drinks    Comment: 1-2 Glasses of wine per day  . Drug use: No  . Sexual activity: Yes    Partners: Male  Other Topics Concern  . 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 Difficulty of Paying Living Expenses: Not on file  Food Insecurity:   .  Worried About Charity fundraiser in the Last Year: Not on file  . Ran Out of Food in the Last Year: Not on file  Transportation Needs:   . Lack of Transportation (Medical): Not on file  . Lack of Transportation (Non-Medical): Not on file  Physical Activity:   . Days of Exercise per Week: Not on file  . Minutes of Exercise per Session: Not on file  Stress:   . Feeling of Stress : Not on file  Social Connections:   . Frequency of Communication with Friends and Family: Not on file  . Frequency of Social Gatherings with Friends and Family: Not on file  . Attends Religious Services: Not on file  . Active Member of Clubs or Organizations: Not on file  . Attends Archivist Meetings: Not on file  . Marital Status: Not on file    Outpatient Medications Prior to Visit  Medication Sig Dispense Refill  . amLODipine (NORVASC) 2.5 MG tablet Take 1 tablet (2.5 mg total) by  mouth daily. 90 tablet 3  . Cholecalciferol (VITAMIN D-3 PO) Take 4,000 Units by mouth daily.    . rosuvastatin (CRESTOR) 20 MG tablet Take 1 tablet (20 mg total) by mouth daily. 90 tablet 3   No facility-administered medications prior to visit.     EXAM:  BP 110/66   Pulse (!) 50   Temp 97.8 F (36.6 C) (Oral)   Ht 5' (1.524 m)   Wt 103 lb (46.7 kg)   SpO2 93%   BMI 20.12 kg/m   Body mass index is 20.12 kg/m. Wt Readings from Last 3 Encounters:  08/20/20 103 lb (46.7 kg)  02/12/20 105 lb 8 oz (47.9 kg)  01/11/20 107 lb 3.2 oz (48.6 kg)    GENERAL: vitals reviewed and listed above, alert, oriented, appears well hydrated and in no acute distress HEENT: atraumatic, conjunctiva  clear, no obvious abnormalities on inspection of external nose and ears  r eac packed with wax   After irrigation  Tm clear and patient  feels better OP : masked  NECK: no obvious masses on inspection palpation  LUNGS: clear to auscultation bilaterally, no wheezes, rales or rhonchi, good air movement CV: HRRR, no clubbing  cyanosis or  peripheral edema nl cap refill  MS: moves all extremities without noticeable focal  abnormality PSYCH: pleasant and cooperative, no obvious depression or anxiety Lab Results  Component Value Date   WBC 7.7 04/02/2019   HGB 13.4 04/02/2019   HCT 39.0 04/02/2019   PLT 226 04/02/2019   GLUCOSE 87 08/01/2019   CHOL 240 (H) 01/26/2018   TRIG 147.0 01/26/2018   HDL 56.20 01/26/2018   LDLCALC 155 (H) 01/26/2018   ALT 10 08/01/2019   AST 14 08/01/2019   NA 139 08/01/2019   K 5.0 08/01/2019   CL 102 08/01/2019   CREATININE 0.66 08/01/2019   BUN 8 08/01/2019   CO2 21 08/01/2019   TSH 3.58 01/26/2018   HGBA1C 5.2 01/26/2018   BP Readings from Last 3 Encounters:  08/20/20 110/66  02/12/20 (!) 142/60  01/11/20 (!) 154/76    ASSESSMENT AND PLAN:  Discussed the following assessment and plan:  Impacted cerumen, right ear  Colon cancer screening declined  Positive colorectal cancer screening using Cologuard test  Mixed hyperlipidemia Still declines colon cancer screening  -Patient advised to return or notify health care team  if  new concerns arise.  Patient Instructions  Glad you are doing well. Continue lifestyle intervention healthy eating and exercise .    Standley Brooking. Marisue Canion M.D.

## 2020-08-20 NOTE — Patient Instructions (Signed)
Glad you are doing well. Continue lifestyle intervention healthy eating and exercise .

## 2020-08-22 NOTE — Telephone Encounter (Signed)
Not really except dont  use q tips that push the wax back in the canal  Can use intermittent softening drops   liquid colace  hsa been used  At night (  This is a stool softener but seems to work  For softening so wax can " fall out easier )   From the [place]  [facility] ( ie no proprietary meds )   Earwax usually falls out on its own. If it does not and blocks your ear, put 2 to 3 drops of medical grade olive or almond oil in your ear 3 to 4 times a day. Do this for 3 to 5 days.  It is recommended you use a dropper while lying your head on one side for a few minutes to let the oil work its way through your ear canal(s).  You may find it easier to do this first thing in the morning and then just before you go to sleep.  Over about 2 weeks, lumps of earwax should fall out of your ear, especially at night when you're lying down.  There's no evidence that ear candles or ear vacuums get rid of earwax.

## 2020-09-19 ENCOUNTER — Telehealth: Payer: Self-pay | Admitting: Internal Medicine

## 2020-09-19 NOTE — Telephone Encounter (Signed)
Left message for patient to call back and schedule Medicare Annual Wellness Visit (AWV) either virtually or in office.  Last AWV 03/31/26; please schedule at anytime with [facility] Nurse Health Advisor 2.  This should be a 45 minute visit.

## 2020-09-25 ENCOUNTER — Ambulatory Visit (INDEPENDENT_AMBULATORY_CARE_PROVIDER_SITE_OTHER): Payer: PPO

## 2020-09-25 ENCOUNTER — Other Ambulatory Visit: Payer: Self-pay

## 2020-09-25 VITALS — BP 110/76 | HR 60 | Temp 98.1°F | Ht 60.0 in | Wt 105.4 lb

## 2020-09-25 DIAGNOSIS — Z0000 Encounter for general adult medical examination without abnormal findings: Secondary | ICD-10-CM | POA: Diagnosis not present

## 2020-09-25 NOTE — Patient Instructions (Signed)
Destiny Cruz , Thank you for taking time to come for your Medicare Wellness Visit. I appreciate your ongoing commitment to your health goals. Please review the following plan we discussed and let me know if I can assist you in the future.   Screening recommendations/referrals: Colonoscopy: Patient declined Mammogram: Up to date, next due 04/03/2021 Bone Density: No longer required  Recommended yearly ophthalmology/optometry visit for glaucoma screening and checkup Recommended yearly dental visit for hygiene and checkup  Vaccinations: Influenza vaccine: Up to date, next due fall 2022 Pneumococcal vaccine: Completed series Tdap vaccine: Up to date, next due 03/16/2028 Shingles vaccine: Patient declined     Advanced directives: Please bring a copy of your medical advanced directives into the office so that we may scan them into your chart.  Conditions/risks identified: None   Next appointment: None    Preventive Care 65 Years and Older, Female Preventive care refers to lifestyle choices and visits with your health care provider that can promote health and wellness. What does preventive care include?  A yearly physical exam. This is also called an annual well check.  Dental exams once or twice a year.  Routine eye exams. Ask your health care provider how often you should have your eyes checked.  Personal lifestyle choices, including:  Daily care of your teeth and gums.  Regular physical activity.  Eating a healthy diet.  Avoiding tobacco and drug use.  Limiting alcohol use.  Practicing safe sex.  Taking low-dose aspirin every day.  Taking vitamin and mineral supplements as recommended by your health care provider. What happens during an annual well check? The services and screenings done by your health care provider during your annual well check will depend on your age, overall health, lifestyle risk factors, and family history of disease. Counseling  Your health care  provider may ask you questions about your:  Alcohol use.  Tobacco use.  Drug use.  Emotional well-being.  Home and relationship well-being.  Sexual activity.  Eating habits.  History of falls.  Memory and ability to understand (cognition).  Work and work Statistician.  Reproductive health. Screening  You may have the following tests or measurements:  Height, weight, and BMI.  Blood pressure.  Lipid and cholesterol levels. These may be checked every 5 years, or more frequently if you are over 78 years old.  Skin check.  Lung cancer screening. You may have this screening every year starting at age 62 if you have a 30-pack-year history of smoking and currently smoke or have quit within the past 15 years.  Fecal occult blood test (FOBT) of the stool. You may have this test every year starting at age 62.  Flexible sigmoidoscopy or colonoscopy. You may have a sigmoidoscopy every 5 years or a colonoscopy every 10 years starting at age 28.  Hepatitis C blood test.  Hepatitis B blood test.  Sexually transmitted disease (STD) testing.  Diabetes screening. This is done by checking your blood sugar (glucose) after you have not eaten for a while (fasting). You may have this done every 1-3 years.  Bone density scan. This is done to screen for osteoporosis. You may have this done starting at age 85.  Mammogram. This may be done every 1-2 years. Talk to your health care provider about how often you should have regular mammograms. Talk with your health care provider about your test results, treatment options, and if necessary, the need for more tests. Vaccines  Your health care provider may recommend certain vaccines,  such as:  Influenza vaccine. This is recommended every year.  Tetanus, diphtheria, and acellular pertussis (Tdap, Td) vaccine. You may need a Td booster every 10 years.  Zoster vaccine. You may need this after age 38.  Pneumococcal 13-valent conjugate (PCV13)  vaccine. One dose is recommended after age 29.  Pneumococcal polysaccharide (PPSV23) vaccine. One dose is recommended after age 37. Talk to your health care provider about which screenings and vaccines you need and how often you need them. This information is not intended to replace advice given to you by your health care provider. Make sure you discuss any questions you have with your health care provider. Document Released: 12/19/2015 Document Revised: 08/11/2016 Document Reviewed: 09/23/2015 Elsevier Interactive Patient Education  2017 [place] Prevention in the Home Falls can cause injuries. They can happen to people of all ages. There are many things you can do to make your home safe and to help prevent falls. What can I do on the outside of my home?  Regularly fix the edges of walkways and driveways and fix any cracks.  Remove anything that might make you trip as you walk through a door, such as a raised step or threshold.  Trim any bushes or trees on the path to your home.  Use bright outdoor lighting.  Clear any walking paths of anything that might make someone trip, such as rocks or tools.  Regularly check to see if handrails are loose or broken. Make sure that both sides of any steps have handrails.  Any raised decks and porches should have guardrails on the edges.  Have any leaves, snow, or ice cleared regularly.  Use sand or salt on walking paths during winter.  Clean up any spills in your garage right away. This includes oil or grease spills. What can I do in the bathroom?  Use night lights.  Install grab bars by the toilet and in the tub and shower. Do not use towel bars as grab bars.  Use non-skid mats or decals in the tub or shower.  If you need to sit down in the shower, use a plastic, non-slip stool.  Keep the floor dry. Clean up any water that spills on the floor as soon as it happens.  Remove soap buildup in the tub or shower  regularly.  Attach bath mats securely with double-sided non-slip rug tape.  Do not have throw rugs and other things on the floor that can make you trip. What can I do in the bedroom?  Use night lights.  Make sure that you have a light by your bed that is easy to reach.  Do not use any sheets or blankets that are too big for your bed. They should not hang down onto the floor.  Have a firm chair that has side arms. You can use this for support while you get dressed.  Do not have throw rugs and other things on the floor that can make you trip. What can I do in the kitchen?  Clean up any spills right away.  Avoid walking on wet floors.  Keep items that you use a lot in easy-to-reach places.  If you need to reach something above you, use a strong step stool that has a grab bar.  Keep electrical cords out of the way.  Do not use floor polish or wax that makes floors slippery. If you must use wax, use non-skid floor wax.  Do not have throw rugs and other things on  the floor that can make you trip. What can I do with my stairs?  Do not leave any items on the stairs.  Make sure that there are handrails on both sides of the stairs and use them. Fix handrails that are broken or loose. Make sure that handrails are as long as the stairways.  Check any carpeting to make sure that it is firmly attached to the stairs. Fix any carpet that is loose or worn.  Avoid having throw rugs at the top or bottom of the stairs. If you do have throw rugs, attach them to the floor with carpet tape.  Make sure that you have a light switch at the top of the stairs and the bottom of the stairs. If you do not have them, ask someone to add them for you. What else can I do to help prevent falls?  Wear shoes that:  Do not have high heels.  Have rubber bottoms.  Are comfortable and fit you well.  Are closed at the toe. Do not wear sandals.  If you use a stepladder:  Make sure that it is fully  opened. Do not climb a closed stepladder.  Make sure that both sides of the stepladder are locked into place.  Ask someone to hold it for you, if possible.  Clearly mark and make sure that you can see:  Any grab bars or handrails.  First and last steps.  Where the edge of each step is.  Use tools that help you move around (mobility aids) if they are needed. These include:  Canes.  Walkers.  Scooters.  Crutches.  Turn on the lights when you go into a dark area. Replace any light bulbs as soon as they burn out.  Set up your furniture so you have a clear path. Avoid moving your furniture around.  If any of your floors are uneven, fix them.  If there are any pets around you, be aware of where they are.  Review your medicines with your doctor. Some medicines can make you feel dizzy. This can increase your chance of falling. Ask your doctor what other things that you can do to help prevent falls. This information is not intended to replace advice given to you by your health care provider. Make sure you discuss any questions you have with your health care provider. Document Released: 09/18/2009 Document Revised: 04/29/2016 Document Reviewed: 12/27/2014 Elsevier Interactive Patient Education  2017 [employer].

## 2020-09-25 NOTE — Progress Notes (Signed)
Subjective:   Destiny Cruz is a 69 y.o. female who presents for Medicare Annual (Subsequent) preventive examination.  .     Review of Systems    N/A  Cardiac Risk Factors include: advanced age (>90mn, >>26women);dyslipidemia     Objective:    Today's Vitals   09/25/20 1435  BP: 110/76  Pulse: 60  Temp: 98.1 F (36.7 C)  TempSrc: Oral  SpO2: 99%  Weight: 105 lb 7 oz (47.8 kg)  Height: 5' (1.524 m)   Body mass index is 20.59 kg/m.  Advanced Directives 09/25/2020 04/16/2019 04/02/2019  Does Patient Have a Medical Advance Directive? Yes Yes Yes  Type of AParamed[place]of [place] will - Living will  Does patient want to make changes to medical advance directive? No - Patient declined - No - Patient declined  Copy of [place]? No - copy requested - -  Would patient like information on creating a medical advance directive? - - No - Patient declined    Current Medications (verified) Outpatient Encounter Medications as of 09/25/2020  Medication Sig  . amLODipine (NORVASC) 2.5 MG tablet Take 1 tablet (2.5 mg total) by mouth daily.  . Cholecalciferol (VITAMIN D-3 PO) Take 4,000 Units by mouth daily.  . rosuvastatin (CRESTOR) 20 MG tablet Take 1 tablet (20 mg total) by mouth daily.   No facility-administered encounter medications on file as of 09/25/2020.    Allergies (verified) Keflex [cephalexin], Penicillins, and Sulfa antibiotics   History: Past Medical History:  Diagnosis Date  . Chicken pox   . Duodenal ulcer    clinical dx when younger responded to med and no recurrance no bleeding  . Gestational diabetes   . Gluten intolerance    possible celiac  see text   . History of scarlatina   . Hx of allergy    as a child hay fever   . Migraines   . UTI (lower urinary tract infection)    once    Past Surgical History:  Procedure Laterality Date  . CESAREAN SECTION  [id number]  Family History    Problem Relation Age of Onset  . Colon cancer Father   . Stroke Father   . Rheum arthritis Mother   . Thyroid disease Daughter        partial thyroidectomy   . Celiac disease Son        probable   Social History   Socioeconomic History  . Marital status: Married    Spouse name: Not on file  . Number of children: Not on file  . Years of education: Not on file  . Highest education level: Not on file  Occupational History  . Occupation: Retired  Tobacco Use  . Smoking status: Never Smoker  . Smokeless tobacco: Never Used  Vaping Use  . Vaping Use: Never used  Substance and Sexual Activity  . Alcohol use: Not Currently    Alcohol/week: 0.0 standard drinks    Comment: 1-2 Glasses of wine per day  . Drug use: No  . Sexual activity: Yes    Partners: Male  Other Topics Concern  . 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  Difficulty of Paying Living Expenses: Not hard at all  Food Insecurity: No Food Insecurity  . Worried About Charity fundraiser in the Last Year: Never true  . Ran Out of Food in the Last Year: Never true  Transportation Needs: No Transportation Needs  . Lack of Transportation (Medical): No  . Lack of Transportation (Non-Medical): No  Physical Activity: Sufficiently Active  . Days of Exercise per Week: 5 days  . Minutes of Exercise per Session: 30 min  Stress: No Stress Concern Present  . Feeling of Stress : Not at all  Social Connections: Socially Integrated  . Frequency of Communication with Friends and Family: More than three times a week  . Frequency of Social Gatherings with Friends and Family: More than three times a week  . Attends Religious Services: More than 4 times per year  . Active Member of Clubs or  Organizations: Yes  . Attends Archivist Meetings: Never  . Marital Status: Married    Tobacco Counseling Counseling given: Not Answered   Clinical Intake:  Pre-visit preparation completed: Yes  Pain : No/denies pain     Nutritional Risks: None Diabetes: No  How often do you need to have someone help you when you read instructions, pamphlets, or other written materials from your doctor or pharmacy?: 1 - Never What is the last grade level you completed in school?: Masters Degree  Diabetic?No  Interpreter Needed?: No  Information entered by :: [place] of Daily Living In your present state of health, do you have any difficulty performing the following activities: 09/25/2020  Hearing? N  Vision? N  Difficulty concentrating or making decisions? N  Walking or climbing stairs? N  Dressing or bathing? N  Doing errands, shopping? N  Preparing Food and eating ? N  Using the Toilet? N  In the past six months, have you accidently leaked urine? N  Do you have problems with loss of bowel control? N  Managing your Medications? N  Managing your Finances? N  Housekeeping or managing your Housekeeping? N  Some recent data might be hidden    Patient Care Team: Panosh, Standley Brooking, MD as PCP - General (Internal Medicine) Nahser, Wonda Cheng, MD as PCP - Cardiology (Cardiology) Clent Jacks, MD as Consulting Physician (Ophthalmology) Cameron Sprang, MD as Consulting Physician (Neurology)  Indicate any recent Medical Services you may have received from other than [facility] providers in the past year (date may be approximate).     Assessment:   This is a routine wellness examination for Destiny Cruz.  Hearing/Vision screen  Hearing Screening   125Hz  250Hz  500Hz  1000Hz  2000Hz  3000Hz  4000Hz  6000Hz  8000Hz   Right ear:           Left ear:           Vision Screening Comments: Patient states gets eyes examined every 2 years   Dietary issues and exercise activities  discussed: Current Exercise Habits: Home exercise routine, Type of exercise: walking, Time (Minutes): 30, Frequency (Times/Week): 6, Weekly Exercise (Minutes/Week): 180, Intensity: Mild  Goals    . Exercise 150 min/wk Moderate Activity      Depression Screen PHQ 2/9 Scores 09/25/2020 02/06/2018 03/31/2016  PHQ - 2 Score 0 0 0  PHQ- 9 Score 0 - -    Fall Risk Fall Risk  09/25/2020 04/16/2019 02/06/2018 03/31/2016  Falls in the past year? 0 0 No No  Number falls in past yr: 0 0 - -  Injury with Fall?  0 0 - -  Risk for fall due to : No Fall Risks - - -  Follow up Falls evaluation completed;Falls prevention discussed - - -    Any stairs in or around the home? No  If so, are there any without handrails? No  Home free of loose throw rugs in walkways, pet beds, electrical cords, etc? Yes  Adequate lighting in your home to reduce risk of falls? Yes   ASSISTIVE DEVICES UTILIZED TO PREVENT FALLS:  Life alert? No  Use of a cane, walker or w/c? No  Grab bars in the bathroom? No  Shower chair or bench in shower? No  Elevated toilet seat or a handicapped toilet? No   TIMED UP AND GO:  Was the test performed? Yes .  Length of time to ambulate 10 feet: 5 sec.   Gait steady and fast without use of assistive device  Cognitive Function:  Cognitive screening within normal limits based on direct observation.      Immunizations Immunization History  Administered Date(s) Administered  . Influenza, High Dose Seasonal PF 08/28/2016, 08/23/2017, 08/23/2018, 08/23/2018, 08/08/2019, 08/09/2020  . Influenza-Unspecified 08/08/2019  . PFIZER SARS-COV-2 Vaccination 01/10/2020, 02/05/2020  . Pneumococcal Conjugate-13 12/03/2015  . Pneumococcal Polysaccharide-23 12/09/2016  . Td 03/16/2018  . Tdap 03/16/2018    TDAP status: Up to date Flu Vaccine status: Up to date Pneumococcal vaccine status: Up to date Covid-19 vaccine status: Completed vaccines  Qualifies for Shingles Vaccine? Yes     Zostavax completed No   Shingrix Completed?: No.    Education has been provided regarding the importance of this vaccine. Patient has been advised to call insurance company to determine out of pocket expense if they have not yet received this vaccine. Advised may also receive vaccine at local pharmacy or Health Dept. Verbalized acceptance and understanding.  Screening Tests Health Maintenance  Topic Date Due  . COLONOSCOPY  Never done  . MAMMOGRAM  04/03/2022  . TETANUS/TDAP  03/16/2028  . INFLUENZA VACCINE  Completed  . DEXA SCAN  Completed  . COVID-19 Vaccine  Completed  . Hepatitis C Screening  Completed  . PNA vac Low Risk Adult  Completed    Health Maintenance  Health Maintenance Due  Topic Date Due  . COLONOSCOPY  Never done   Colorectal Cancer Screening: Patient declined Mammogram status: Completed 04/03/2020. Repeat every year Bone Density status: Ordered 09/25/2020. Pt provided with contact info and advised to call to schedule appt.  Lung Cancer Screening: (Low Dose CT Chest recommended if Age 46-80 years, 30 pack-year currently smoking OR have quit w/in 15years.) does not qualify.   Lung Cancer Screening Referral: N/A   Additional Screening:  Hepatitis C Screening: does qualify; Completed 11/09/2016  Vision Screening: Recommended annual ophthalmology exams for early detection of glaucoma and other disorders of the eye. Is the patient up to date with their annual eye exam?  No  Who is the provider or what is the name of the office in which the patient attends annual eye exams? Dr.Groat If pt is not established with a provider, would they like to be referred to a provider to establish care? No .   Dental Screening: Recommended annual dental exams for proper oral hygiene  Community Resource Referral / Chronic Care Management: CRR required this visit?  No   CCM required this visit?  No      Plan:     I have personally reviewed and noted the following in the  patient's chart:   .  Medical and social history . Use of alcohol, tobacco or illicit drugs  . Current medications and supplements . Functional ability and status . Nutritional status . Physical activity . Advanced directives . List of other physicians . Hospitalizations, surgeries, and ER visits in previous 12 months . Vitals . Screenings to include cognitive, depression, and falls . Referrals and appointments  In addition, I have reviewed and discussed with patient certain preventive protocols, quality metrics, and best practice recommendations. A written personalized care plan for preventive services as well as general preventive health recommendations were provided to patient.     Ofilia Neas, LPN   30/13/1438   Nurse Notes: None

## 2020-10-01 ENCOUNTER — Other Ambulatory Visit: Payer: Self-pay | Admitting: Internal Medicine

## 2020-10-07 ENCOUNTER — Other Ambulatory Visit: Payer: Self-pay | Admitting: Internal Medicine

## 2020-10-07 DIAGNOSIS — I2584 Coronary atherosclerosis due to calcified coronary lesion: Secondary | ICD-10-CM

## 2020-10-07 DIAGNOSIS — E7800 Pure hypercholesterolemia, unspecified: Secondary | ICD-10-CM

## 2020-11-03 DIAGNOSIS — H2513 Age-related nuclear cataract, bilateral: Secondary | ICD-10-CM | POA: Diagnosis not present

## 2020-11-03 DIAGNOSIS — H0288B Meibomian gland dysfunction left eye, upper and lower eyelids: Secondary | ICD-10-CM | POA: Diagnosis not present

## 2020-11-03 DIAGNOSIS — H04123 Dry eye syndrome of bilateral lacrimal glands: Secondary | ICD-10-CM | POA: Diagnosis not present

## 2020-11-03 DIAGNOSIS — H0288A Meibomian gland dysfunction right eye, upper and lower eyelids: Secondary | ICD-10-CM | POA: Diagnosis not present

## 2020-11-04 NOTE — Telephone Encounter (Signed)
I would consider getting booster now   Based on current data    Also  We are beginning to see influenza  Make sure all vaccinated and  Avoid   Exposure if having symptoms   break through covid   Often acts   like a cold    Im  Encouraging at home testing  If having a cold sx or  Before visiting  High risk individual like  Your mom   If practical .

## 2021-01-03 ENCOUNTER — Other Ambulatory Visit: Payer: Self-pay | Admitting: Internal Medicine

## 2021-01-06 DIAGNOSIS — E7800 Pure hypercholesterolemia, unspecified: Secondary | ICD-10-CM | POA: Diagnosis not present

## 2021-01-06 DIAGNOSIS — I2584 Coronary atherosclerosis due to calcified coronary lesion: Secondary | ICD-10-CM | POA: Diagnosis not present

## 2021-01-06 DIAGNOSIS — I2510 Atherosclerotic heart disease of native coronary artery without angina pectoris: Secondary | ICD-10-CM | POA: Diagnosis not present

## 2021-01-07 LAB — NMR, LIPOPROFILE
Cholesterol, Total: 153 mg/dL (ref 100–199)
HDL Particle Number: 39.1 umol/L (ref >=30.5)
HDL-C: 64 mg/dL (ref >39)
LDL Particle Number: 863 nmol/L (ref <1000)
LDL Size: 20.4 nm — ABNORMAL LOW (ref >20.5)
LDL-C (NIH Calc): 70 mg/dL (ref 0–99)
LP-IR Score: <25 (ref <=45)
Small LDL Particle Number: 357 nmol/L (ref <=527)
Triglycerides: 104 mg/dL (ref 0–149)

## 2021-01-14 ENCOUNTER — Other Ambulatory Visit: Payer: Self-pay

## 2021-01-14 ENCOUNTER — Ambulatory Visit: Payer: PPO | Admitting: Internal Medicine

## 2021-01-14 ENCOUNTER — Encounter: Payer: Self-pay | Admitting: Internal Medicine

## 2021-01-14 VITALS — BP 138/64 | HR 60 | Ht 60.0 in | Wt 107.0 lb

## 2021-01-14 DIAGNOSIS — I10 Essential (primary) hypertension: Secondary | ICD-10-CM

## 2021-01-14 DIAGNOSIS — I2584 Coronary atherosclerosis due to calcified coronary lesion: Secondary | ICD-10-CM

## 2021-01-14 DIAGNOSIS — I2510 Atherosclerotic heart disease of native coronary artery without angina pectoris: Secondary | ICD-10-CM

## 2021-01-14 DIAGNOSIS — E7800 Pure hypercholesterolemia, unspecified: Secondary | ICD-10-CM | POA: Diagnosis not present

## 2021-01-14 NOTE — Progress Notes (Signed)
LIPID CLINIC CONSULT NOTE  Chief Complaint:  Follow-up dyslipidemia  Primary Care Physician: Burnis Medin, MD  Primary Cardiologist:  Mertie Moores, MD  HPI:  Destiny Cruz is a 70 y.o. female who is being seen today for the evaluation of dyslipidemia at the request of Panosh, Standley Brooking, MD. This is a pleasant 70 year old female who is a wife of a patient of mine.  She is followed by Dr. Acie Fredrickson and was referred for management of dyslipidemia.  She has a history of recent hypertension now managed on Micardis.  She does have longstanding dyslipidemia.  In fact she brought in a complex graft today which indicates her lipids that have been tracked back to 2002.  She has had persistently elevated LDL-C the lowest being 131 but is high as recently 246 as of August 2020.  Her total cholesterol has been persistently over 200 for years.  Recently she had coronary artery calcium scoring which was elevated at 114, intermediate risk and the 76 percentile based on age and sex matched controls.  She is completely asymptomatic from a cardiac standpoint.  Says she eats a healthy diet which is according to [facility] guidelines will not make any dietary modifications.  Recently she has lost weight and she seems to be comfortable with her current weight which is in range and acceptable BMI of 21.  Physical activity is minimal.  There is a family history of hemorrhagic stroke as well as heart disease which is concerning to her.  And the reason she presented was for further evaluation and management recommendations.  Most recently her lipid profile which was a lipid NMR showed a total cholesterol of 328, triglycerides 92, HDL 64 and LDL-C of 246.  Her LDL-P was 2722 nmol/L and a LPA was normal at 59.  APO B was significantly elevated 176 with an increase in small LDL-P of greater than 800.  Overall a significantly abnormal lipid profile suggesting increased cardiovascular risk.  01/11/2020  Destiny Cruz returns  today for follow-up.  Overall she is doing extremely well.  She has had significant reduction in her lipids on 20 mg of rosuvastatin but is also lost a lot of weight and changed her diet significantly.  She is down to 107 pounds today.  Her lipid NMR previously showed a LDL-P of 2723, now reduced to 836 particles.  LDL-C is now 81, HDL-C 64, small LDL-P is 316, triglycerides 83.  Overall marked improvement in her lipids and very near goal.  Best overall she is tolerating the statin without any side effects.  01/14/2021  Destiny Cruz is seen today in follow-up.  Her lipids continue to look very good.  Her total cholesterol is 153, HDL 64, LDL 70 and triglycerides 104.  Blood pressure is well controlled at home although was slightly elevated today.  She is on low-dose amlodipine primarily for Raynaud's and also for her blood pressure.  She is also followed by Dr. Acie Fredrickson.  PMHx:  Past Medical History:  Diagnosis Date  . Chicken pox   . Duodenal ulcer    clinical dx when younger responded to med and no recurrance no bleeding  . Gestational diabetes   . Gluten intolerance    possible celiac  see text   . History of scarlatina   . Hx of allergy    as a child hay fever   . Migraines   . UTI (lower urinary tract infection)    once     Past  Surgical History:  Procedure Laterality Date  . CESAREAN SECTION  [id number]    FAMHx:  Family History  Problem Relation Age of Onset  . Colon cancer Father   . Stroke Father   . Rheum arthritis Mother   . Thyroid disease Daughter        partial thyroidectomy   . Celiac disease Son        probable    SOCHx:   reports that she has never smoked. She has never used smokeless tobacco. She reports previous alcohol use. She reports that she does not use drugs.  ALLERGIES:  Allergies  Allergen Reactions  . Keflex [Cephalexin] Hives  . Penicillins Hives  . Sulfa Antibiotics Hives    ROS: Pertinent items noted in HPI and remainder of comprehensive  ROS otherwise negative.  HOME MEDS: Current Outpatient Medications on File Prior to Visit  Medication Sig Dispense Refill  . amLODipine (NORVASC) 2.5 MG tablet Take 1 tablet (2.5 mg total) by mouth daily. 90 tablet 3  . Cholecalciferol (VITAMIN D-3 PO) Take 4,000 Units by mouth daily.    . rosuvastatin (CRESTOR) 20 MG tablet TAKE 1 TABLET ONCE DAILY. 90 tablet 0   No current facility-administered medications on file prior to visit.    LABS/IMAGING: No results found for this or any previous visit (from the past 48 hour(s)). No results found.  LIPID PANEL:    Component Value Date/Time   CHOL 240 (H) 01/26/2018 0750   TRIG 147.0 01/26/2018 0750   HDL 56.20 01/26/2018 0750   CHOLHDL 4 01/26/2018 0750   VLDL 29.4 01/26/2018 0750   LDLCALC 155 (H) 01/26/2018 0750    WEIGHTS: Wt Readings from Last 3 Encounters:  01/14/21 107 lb (48.5 kg)  09/25/20 105 lb 7 oz (47.8 kg)  08/20/20 103 lb (46.7 kg)    VITALS: BP 138/64 (BP Location: Left Arm, Patient Position: Sitting, Cuff Size: Normal)   Pulse 60   Ht 5' (1.524 m)   Wt 107 lb (48.5 kg)   BMI 20.90 kg/m   EXAM: General appearance: alert and no distress Neck: no carotid bruit, no JVD and thyroid not enlarged, symmetric, no tenderness/mass/nodules Lungs: clear to auscultation bilaterally Heart: regular rate and rhythm Extremities: extremities normal, atraumatic, no cyanosis or edema Skin: Skin color, texture, turgor normal. No rashes or lesions Psych: Pleasant  EKG: Deferred  ASSESSMENT: 1. Mixed dyslipidemia, goal LDL less than 70 2. Elevated CAC score 114 (2020)-76th percentile for age and sex matched control 3. Hypertension  PLAN: 1.   Destiny Cruz continues to do well and is at target LDL of 70.  She is tolerating statin therapy.  She is on low-dose amlodipine.  Overall she feels well.  She remains active and is of normal weight with good blood pressure control generally.  No changes to her medicines today.  She  has follow-up with her primary cardiologist next month.  Ultimately she wishes to transition to me after Dr. Elmarie Shiley retirement.  Follow-up with me in 1 year or sooner as necessary.  Pixie Casino, MD, [facility], [place] Director of the Advanced Lipid Disorders &  Cardiovascular Risk Reduction Clinic Diplomate of the American Board of Clinical Lipidology Attending Cardiologist  Direct Dial: [phone]  Fax: [phone]  Website:  [email or web address] [email or web address] Mirtie Bastyr 01/14/2021, 9:45 AM

## 2021-01-14 NOTE — Patient Instructions (Signed)
Medication Instructions:   No changes *If you need a refill on your cardiac medications before your next appointment, please call your pharmacy*   Lab Work: No needed   Testing/Procedures: Not needed   Follow-Up: At [facility], you and your health needs are our priority.  As part of our continuing mission to provide you with exceptional heart care, we have created designated Provider Care Teams.  These Care Teams include your primary Cardiologist (physician) and Advanced Practice Providers (APPs -  Physician Assistants and Nurse Practitioners) who all work together to provide you with the care you need, when you need it.     Your next appointment:   12 month(s)  The format for your next appointment:   In Person  Provider:   K. Mali Hilty, MD

## 2021-02-01 ENCOUNTER — Other Ambulatory Visit: Payer: Self-pay | Admitting: Cardiovascular Disease

## 2021-02-15 ENCOUNTER — Encounter: Payer: Self-pay | Admitting: Cardiovascular Disease

## 2021-02-15 NOTE — Progress Notes (Unsigned)
Cardiology Office Note:    Date:  02/16/2021   ID:  Destiny Cruz, DOB [birth year], MRN [id number]  PCP:  Burnis Medin, MD  Cardiologist:  Mertie Moores, MD   Referring MD: Burnis Medin, MD   Chief Complaint  Patient presents with  . Hypertension    Apr 05, 2018   Destiny Cruz is a 70 y.o. female with a hx of HTN for years  Much higher this past year .  Amlodipine 5 mg a day , then increased to 10 mg a day  Has been having PVC s - Coreg 12.5  BID was added  She brought her blood pressure log with her.  A lot of her readings over the past month have been in the 1 50-1 60 range.  With these recent increasing medication doses her blood pressures typically in the 1 30-1 40 range.  Occasionally her blood pressure is well controlled but she still has occasional systolic blood pressures of 160.   No severely blood elevated blood pressure since doubling the amlodipine last week.  Does not eat salt  Has been losing weight on her diet .   She walks 30 minuets a day ,   Is a retired Licensed conveyancer   Father died of a hemorhagic stroke , mother had afib.  No CP , no dyspnea.    Aug. 30, 2019:   And is seen today for follow-up visit.  She has a history of hypertension.  We added hydrochlorthiazide 25  mg a day as well as potassium chloride 10 mEq a day at her last visit.  Her blood pressure is a bit better. On Coreg 6. 25  BID . Walks every day / most days  3-4 miles a day  Still has some occasional episodes of dizziness. Admits that she does not drink enough Has Raynauds syndrome - has symptoms in hot and cold weather Her BP meds seem to be helping this problem   Sept. 3, 2020  Destiny Cruz is seen today for follow up of her HTN. She had lipid levels drawn recently.  Her ApoB level is elevated at 176.  Total cholesterol is 328.  LDL particle number is 2722.  The LDL is 246.  Triglycerides equal 92. Attributes her elevated chol to her Keto diet.   BP log looks good  Has lost  20 lbs   No cp, no dyspnea Wants to get a coronary calcium score   February 12, 2020  Destiny Cruz is seen for follow up of her hyperlipidemia and HTN.  She has been followed by Dr. Debara Pickett for her HLD. Coronary calcium score  (Oct. 8, 2020)  of 114. This was 76th percentile for age and sex matched control. She will be following up with Dr. Debara Pickett annually. BP has been well controlled  Has lost 10 lbs since her last office visit in Sept.  Exercising regularly .  Raynauds has been bothering her  PVcs have resolved.  Overall she is feeling well  She had an episode of tachycardia -  ( has a hiatal hernia possibly a reaction to eating full fat dairy )   February 16, 2021  Destiny Cruz is seen for follow up of her HTN and HLD . BP is a bit elevated  - she admits to eating more salt this weekend  BP is well controlled at home  Lipids look good from Feb. 1, 2022  She has been seeing Dr. Debara Pickett.   She would like to  estabilish with Dr. Debara Pickett as I slow down and retire.   Past Medical History:  Diagnosis Date  . Chicken pox   . Duodenal ulcer    clinical dx when younger responded to med and no recurrance no bleeding  . Gestational diabetes   . Gluten intolerance    possible celiac  see text   . History of scarlatina   . Hx of allergy    as a child hay fever   . Migraines   . UTI (lower urinary tract infection)    once     Past Surgical History:  Procedure Laterality Date  . CESAREAN SECTION  [id number]    Current Medications: Current Meds  Medication Sig  . amLODipine (NORVASC) 2.5 MG tablet TAKE 1 TABLET ONCE DAILY.  Marland Kitchen Cholecalciferol (VITAMIN D-3 PO) Take 4,000 Units by mouth daily.  . rosuvastatin (CRESTOR) 20 MG tablet TAKE 1 TABLET ONCE DAILY.     Allergies:   Keflex [cephalexin], Penicillins, and Sulfa antibiotics   Social History   Socioeconomic History  . Marital status: Married    Spouse name: Not on file  . Number of children: Not on file  . Years of education: Not on file  .  Highest education level: Not on file  Occupational History  . Occupation: Retired  Tobacco Use  . Smoking status: Never Smoker  . Smokeless tobacco: Never Used  Vaping Use  . Vaping Use: Never used  Substance and Sexual Activity  . Alcohol use: Not Currently    Alcohol/week: 0.0 standard drinks    Comment: 1-2 Glasses of wine per day  . Drug use: No  . Sexual activity: Yes    Partners: Male  Other Topics Concern  . 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 Difficulty of Paying Living Expenses: Not hard at all  Food Insecurity: No Food Insecurity  . Worried About Charity fundraiser in the Last Year: Never true  . Ran Out of Food in the Last Year: Never true  Transportation Needs: No Transportation Needs  . Lack of Transportation (Medical): No  . Lack of Transportation (Non-Medical): No  Physical Activity: Sufficiently Active  . Days of Exercise per Week: 5 days  . Minutes of Exercise per Session: 30 min  Stress: No Stress Concern Present  . Feeling of Stress : Not at all  Social Connections: Socially Integrated  . Frequency of Communication with Friends and Family: More than three times a week  . Frequency of Social Gatherings with Friends and Family: More than three times a week  . Attends Religious Services: More than 4 times per year  . Active Member of Clubs or Organizations: Yes  . Attends Archivist Meetings: Never  . Marital Status: Married     Family History: The patient's family history includes Celiac disease in her son; Colon cancer in her father; Rheum arthritis in her mother; Stroke in her father; Thyroid disease in her daughter.  ROS:   Please see the history of present illness.       EKGs/Labs/Other Studies  Reviewed:     EKG:   February 16, 2021: Normal sinus rhythm with occasional premature atrial contractions and premature ventricular  contractions.  Recent Labs: No results found for requested labs within last 8760 hours.  Recent Lipid Panel    Component Value Date/Time   CHOL 240 (H) 01/26/2018 0750   TRIG 147.0 01/26/2018 0750   HDL 56.20 01/26/2018 0750   CHOLHDL 4 01/26/2018 0750   VLDL 29.4 01/26/2018 0750   LDLCALC 155 (H) 01/26/2018 0750    Physical Exam: Blood pressure (!) 150/70, pulse 60, height 5' (1.524 m), weight 107 lb (48.5 kg), SpO2 98 %.  GEN:  Well nourished, well developed in no acute distress HEENT: Normal NECK: No JVD; No carotid bruits LYMPHATICS: No lymphadenopathy CARDIAC: RRR ,  RESPIRATORY:  Clear to auscultation without rales, wheezing or rhonchi  ABDOMEN: Soft, non-tender, non-distended MUSCULOSKELETAL:  No edema; No deformity  SKIN: Warm and dry NEUROLOGIC:  Alert and oriented x 3    ASSESSMENT:    1. Essential hypertension    PLAN:     1.  Hyperlipidemia:   Lipids look great.   She has been seeing Dr. Debara Pickett.   We will reassign her to Dr Debara Pickett as her primary cardiologist since I a slowing down .   HTN -     BP looks good.   Her BP log shows that her BP is almost always well controlled.    Its a bit elevated today because she had a very salty diet this weekend .   2. PVCs -   Asymptomatic        Medication Adjustments/Labs and Tests Ordered: Current medicines are reviewed at length with the patient today.  Concerns regarding medicines are outlined above.  Orders Placed This Encounter  Procedures  . EKG 12-Lead   No orders of the defined types were placed in this encounter.    Patient Instructions  Medication Instructions:  Your physician recommends that you continue on your current medications as directed. Please refer to the Current Medication list given to you today.  *If you need a refill on your cardiac medications before your  next appointment, please call your pharmacy*   Lab Work: none If you have labs (blood work) drawn today and your tests are completely normal, you will receive your results only by: Marland Kitchen MyChart Message (if you have MyChart) OR . A paper copy in the mail If you have any lab test that is abnormal or we need to change your treatment, we will call you to review the results.   Testing/Procedures: none   Follow-Up: At [facility], you and your health needs are our priority.  As part of our continuing mission to provide you with exceptional heart care, we have created designated Provider Care Teams.  These Care Teams include your primary Cardiologist (physician) and Advanced Practice Providers (APPs -  Physician Assistants and Nurse Practitioners) who all work together to provide you with the care you need, when you need it.  Your next appointment:   1 year  The format for your next appointment:   In Person  Provider:   K. Mali Hilty, MD      Signed, Mertie Moores, MD  02/16/2021 9:59 AM    [place]

## 2021-02-16 ENCOUNTER — Other Ambulatory Visit: Payer: Self-pay

## 2021-02-16 ENCOUNTER — Ambulatory Visit: Payer: PPO | Admitting: Cardiovascular Disease

## 2021-02-16 ENCOUNTER — Encounter: Payer: Self-pay | Admitting: Cardiovascular Disease

## 2021-02-16 VITALS — BP 150/70 | HR 60 | Ht 60.0 in | Wt 107.0 lb

## 2021-02-16 DIAGNOSIS — I10 Essential (primary) hypertension: Secondary | ICD-10-CM

## 2021-02-16 NOTE — Patient Instructions (Addendum)
Medication Instructions:  Your physician recommends that you continue on your current medications as directed. Please refer to the Current Medication list given to you today.  *If you need a refill on your cardiac medications before your next appointment, please call your pharmacy*   Lab Work: none If you have labs (blood work) drawn today and your tests are completely normal, you will receive your results only by: Marland Kitchen MyChart Message (if you have MyChart) OR . A paper copy in the mail If you have any lab test that is abnormal or we need to change your treatment, we will call you to review the results.   Testing/Procedures: none   Follow-Up: At [facility], you and your health needs are our priority.  As part of our continuing mission to provide you with exceptional heart care, we have created designated Provider Care Teams.  These Care Teams include your primary Cardiologist (physician) and Advanced Practice Providers (APPs -  Physician Assistants and Nurse Practitioners) who all work together to provide you with the care you need, when you need it.  Your next appointment:   1 year  The format for your next appointment:   In Person  Provider:   Raliegh Ip Mali Hilty, MD

## 2021-03-02 ENCOUNTER — Other Ambulatory Visit: Payer: Self-pay | Admitting: Internal Medicine

## 2021-03-02 DIAGNOSIS — Z1231 Encounter for screening mammogram for malignant neoplasm of breast: Secondary | ICD-10-CM

## 2021-04-01 ENCOUNTER — Other Ambulatory Visit: Payer: Self-pay | Admitting: Internal Medicine

## 2021-04-15 ENCOUNTER — Other Ambulatory Visit: Payer: Self-pay

## 2021-04-15 ENCOUNTER — Ambulatory Visit
Admission: RE | Admit: 2021-04-15 | Discharge: 2021-04-15 | Disposition: A | Discharge status: Home / Self Care | Payer: PPO | Source: Ambulatory Visit

## 2021-04-15 DIAGNOSIS — Z1231 Encounter for screening mammogram for malignant neoplasm of breast: Secondary | ICD-10-CM | POA: Diagnosis not present

## 2021-04-28 ENCOUNTER — Other Ambulatory Visit: Payer: Self-pay | Admitting: Internal Medicine

## 2021-07-28 ENCOUNTER — Telehealth: Payer: Self-pay | Admitting: Internal Medicine

## 2021-07-28 DIAGNOSIS — E7800 Pure hypercholesterolemia, unspecified: Secondary | ICD-10-CM

## 2021-07-28 DIAGNOSIS — I2510 Atherosclerotic heart disease of native coronary artery without angina pectoris: Secondary | ICD-10-CM

## 2021-07-28 DIAGNOSIS — E782 Mixed hyperlipidemia: Secondary | ICD-10-CM

## 2021-07-28 NOTE — Telephone Encounter (Signed)
Patient needs lab orders placed to get blood work done prior to lipid clinic appt on 02/07. Thank you!

## 2021-07-28 NOTE — Telephone Encounter (Signed)
Order for NMR lipoprofile placed  Mailed to patient

## 2021-08-11 ENCOUNTER — Telehealth: Payer: Self-pay | Admitting: Internal Medicine

## 2021-08-11 NOTE — Telephone Encounter (Signed)
Left message for patient to call back and schedule Medicare Annual Wellness Visit (AWV) either virtually or in office. Left  my Herbie Drape number [phone]   Last AWV 09/25/20  please schedule at anytime with [facility] Nurse Health Advisor 1 or 2   This should be a 45 minute visit.    [employer] insurance can be calendar year

## 2021-08-13 ENCOUNTER — Ambulatory Visit: Payer: PPO

## 2021-08-13 ENCOUNTER — Other Ambulatory Visit: Payer: Self-pay

## 2021-08-13 VITALS — BP 132/68 | HR 56 | Temp 97.7°F | Ht 61.0 in | Wt 108.9 lb

## 2021-08-13 DIAGNOSIS — Z780 Asymptomatic menopausal state: Secondary | ICD-10-CM

## 2021-08-13 DIAGNOSIS — Z0000 Encounter for general adult medical examination without abnormal findings: Secondary | ICD-10-CM

## 2021-08-13 NOTE — Patient Instructions (Signed)
Destiny Cruz , Thank you for taking time to come for your Medicare Wellness Visit. I appreciate your ongoing commitment to your health goals. Please review the following plan we discussed and let me know if I can assist you in the future.   Screening recommendations/referrals: Colonoscopy: Cologuard 02/21/18 declines colonoscopy referral  Mammogram: 04/15/2021 Bone Density: referral 08/13/2021 Recommended yearly ophthalmology/optometry visit for glaucoma screening and checkup Recommended yearly dental visit for hygiene and checkup  Vaccinations: Influenza vaccine: due in fall 2022  Pneumococcal vaccine: completed series Tdap vaccine: 03/16/2018 Shingles vaccine: declines     Advanced directives: will provide copies   Conditions/risks identified: none   Next appointment: CPE 09/21/2021  130pm  Dr.Panosh    Preventive Care 72 Years and Older, Female Preventive care refers to lifestyle choices and visits with your health care provider that can promote health and wellness. What does preventive care include? A yearly physical exam. This is also called an annual well check. Dental exams once or twice a year. Routine eye exams. Ask your health care provider how often you should have your eyes checked. Personal lifestyle choices, including: Daily care of your teeth and gums. Regular physical activity. Eating a healthy diet. Avoiding tobacco and drug use. Limiting alcohol use. Practicing safe sex. Taking low-dose aspirin every day. Taking vitamin and mineral supplements as recommended by your health care provider. What happens during an annual well check? The services and screenings done by your health care provider during your annual well check will depend on your age, overall health, lifestyle risk factors, and family history of disease. Counseling  Your health care provider may ask you questions about your: Alcohol use. Tobacco use. Drug use. Emotional well-being. Home and  relationship well-being. Sexual activity. Eating habits. History of falls. Memory and ability to understand (cognition). Work and work Statistician. Reproductive health. Screening  You may have the following tests or measurements: Height, weight, and BMI. Blood pressure. Lipid and cholesterol levels. These may be checked every 5 years, or more frequently if you are over 21 years old. Skin check. Lung cancer screening. You may have this screening every year starting at age 62 if you have a 30-pack-year history of smoking and currently smoke or have quit within the past 15 years. Fecal occult blood test (FOBT) of the stool. You may have this test every year starting at age 47. Flexible sigmoidoscopy or colonoscopy. You may have a sigmoidoscopy every 5 years or a colonoscopy every 10 years starting at age 11. Hepatitis C blood test. Hepatitis B blood test. Sexually transmitted disease (STD) testing. Diabetes screening. This is done by checking your blood sugar (glucose) after you have not eaten for a while (fasting). You may have this done every 1-3 years. Bone density scan. This is done to screen for osteoporosis. You may have this done starting at age 62. Mammogram. This may be done every 1-2 years. Talk to your health care provider about how often you should have regular mammograms. Talk with your health care provider about your test results, treatment options, and if necessary, the need for more tests. Vaccines  Your health care provider may recommend certain vaccines, such as: Influenza vaccine. This is recommended every year. Tetanus, diphtheria, and acellular pertussis (Tdap, Td) vaccine. You may need a Td booster every 10 years. Zoster vaccine. You may need this after age 48. Pneumococcal 13-valent conjugate (PCV13) vaccine. One dose is recommended after age 22. Pneumococcal polysaccharide (PPSV23) vaccine. One dose is recommended after age 95. Talk  to your health care provider  about which screenings and vaccines you need and how often you need them. This information is not intended to replace advice given to you by your health care provider. Make sure you discuss any questions you have with your health care provider. Document Released: 12/19/2015 Document Revised: 08/11/2016 Document Reviewed: 09/23/2015 Elsevier Interactive Patient Education  2017 [place] Prevention in the Home Falls can cause injuries. They can happen to people of all ages. There are many things you can do to make your home safe and to help prevent falls. What can I do on the outside of my home? Regularly fix the edges of walkways and driveways and fix any cracks. Remove anything that might make you trip as you walk through a door, such as a raised step or threshold. Trim any bushes or trees on the path to your home. Use bright outdoor lighting. Clear any walking paths of anything that might make someone trip, such as rocks or tools. Regularly check to see if handrails are loose or broken. Make sure that both sides of any steps have handrails. Any raised decks and porches should have guardrails on the edges. Have any leaves, snow, or ice cleared regularly. Use sand or salt on walking paths during winter. Clean up any spills in your garage right away. This includes oil or grease spills. What can I do in the bathroom? Use night lights. Install grab bars by the toilet and in the tub and shower. Do not use towel bars as grab bars. Use non-skid mats or decals in the tub or shower. If you need to sit down in the shower, use a plastic, non-slip stool. Keep the floor dry. Clean up any water that spills on the floor as soon as it happens. Remove soap buildup in the tub or shower regularly. Attach bath mats securely with double-sided non-slip rug tape. Do not have throw rugs and other things on the floor that can make you trip. What can I do in the bedroom? Use night lights. Make sure  that you have a light by your bed that is easy to reach. Do not use any sheets or blankets that are too big for your bed. They should not hang down onto the floor. Have a firm chair that has side arms. You can use this for support while you get dressed. Do not have throw rugs and other things on the floor that can make you trip. What can I do in the kitchen? Clean up any spills right away. Avoid walking on wet floors. Keep items that you use a lot in easy-to-reach places. If you need to reach something above you, use a strong step stool that has a grab bar. Keep electrical cords out of the way. Do not use floor polish or wax that makes floors slippery. If you must use wax, use non-skid floor wax. Do not have throw rugs and other things on the floor that can make you trip. What can I do with my stairs? Do not leave any items on the stairs. Make sure that there are handrails on both sides of the stairs and use them. Fix handrails that are broken or loose. Make sure that handrails are as long as the stairways. Check any carpeting to make sure that it is firmly attached to the stairs. Fix any carpet that is loose or worn. Avoid having throw rugs at the top or bottom of the stairs. If you do have throw rugs,  attach them to the floor with carpet tape. Make sure that you have a light switch at the top of the stairs and the bottom of the stairs. If you do not have them, ask someone to add them for you. What else can I do to help prevent falls? Wear shoes that: Do not have high heels. Have rubber bottoms. Are comfortable and fit you well. Are closed at the toe. Do not wear sandals. If you use a stepladder: Make sure that it is fully opened. Do not climb a closed stepladder. Make sure that both sides of the stepladder are locked into place. Ask someone to hold it for you, if possible. Clearly mark and make sure that you can see: Any grab bars or handrails. First and last steps. Where the edge of  each step is. Use tools that help you move around (mobility aids) if they are needed. These include: Canes. Walkers. Scooters. Crutches. Turn on the lights when you go into a dark area. Replace any light bulbs as soon as they burn out. Set up your furniture so you have a clear path. Avoid moving your furniture around. If any of your floors are uneven, fix them. If there are any pets around you, be aware of where they are. Review your medicines with your doctor. Some medicines can make you feel dizzy. This can increase your chance of falling. Ask your doctor what other things that you can do to help prevent falls. This information is not intended to replace advice given to you by your health care provider. Make sure you discuss any questions you have with your health care provider. Document Released: 09/18/2009 Document Revised: 04/29/2016 Document Reviewed: 12/27/2014 Elsevier Interactive Patient Education  2017 [employer].

## 2021-08-13 NOTE — Progress Notes (Signed)
Subjective:   Destiny Cruz is a 70 y.o. female who presents for Medicare Annual (Subsequent) preventive examination.  Review of Systems    N/a       Objective:    There were no vitals filed for this visit. There is no height or weight on file to calculate BMI.  Advanced Directives 09/25/2020 04/16/2019 04/02/2019  Does Patient Have a Medical Advance Directive? Yes Yes Yes  Type of Paramedic of [place];Living will - Living will  Does patient want to make changes to medical advance directive? No - Patient declined - No - Patient declined  Copy of Faith in Chart? No - copy requested - -  Would patient like information on creating a medical advance directive? - - No - Patient declined    Current Medications (verified) Outpatient Encounter Medications as of 08/13/2021  Medication Sig   amLODipine (NORVASC) 2.5 MG tablet TAKE ONE TABLET ONCE DAILY   Cholecalciferol (VITAMIN D-3 PO) Take 4,000 Units by mouth daily.   rosuvastatin (CRESTOR) 20 MG tablet TAKE 1 TABLET ONCE DAILY.   No facility-administered encounter medications on file as of 08/13/2021.    Allergies (verified) Keflex [cephalexin], Penicillins, and Sulfa antibiotics   History: Past Medical History:  Diagnosis Date   Chicken pox    Duodenal ulcer    clinical dx when younger responded to med and no recurrance no bleeding   Gestational diabetes    Gluten intolerance    possible celiac  see text    History of scarlatina    Hx of allergy    as a child hay fever    Migraines    UTI (lower urinary tract infection)    once    Past Surgical History:  Procedure Laterality Date   CESAREAN SECTION  3016,0109   Family History  Problem Relation Age of Onset   Colon cancer Father    Stroke Father    Rheum arthritis Mother    Thyroid disease Daughter        partial thyroidectomy    Celiac disease Son        probable   Social History   Socioeconomic  History   Marital status: Married    Spouse name: Not on file   Number of children: Not on file   Years of education: Not on file   Highest education level: Not on file  Occupational History   Occupation: Retired  Tobacco Use   Smoking status: Never   Smokeless tobacco: Never  Vaping Use   Vaping Use: Never used  Substance and Sexual Activity   Alcohol use: Not Currently    Alcohol/week: 0.0 standard drinks    Comment: 1-2 Glasses of wine per day   Drug use: No   Sexual activity: Yes    Partners: Male  Other Topics Concern   Not on file  Social History Narrative   Retired on 10/06/2015   Lives her husband and mother   Masters science degree  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    G2P2 c section   Mom has rheumatoid arthritis   Social Determinants of Radio broadcast assistant Strain: Low Risk    Difficulty of Paying Living Expenses: Not hard at all  Food Insecurity: No Food Insecurity   Worried About Charity fundraiser in the Last Year: Never true   Arboriculturist in the Last Year: Never true  Transportation Needs: No Transportation Needs  Lack of Transportation (Medical): No   Lack of Transportation (Non-Medical): No  Physical Activity: Sufficiently Active   Days of Exercise per Week: 5 days   Minutes of Exercise per Session: 30 min  Stress: No Stress Concern Present   Feeling of Stress : Not at all  Social Connections: Socially Integrated   Frequency of Communication with Friends and Family: More than three times a week   Frequency of Social Gatherings with Friends and Family: More than three times a week   Attends Religious Services: More than 4 times per year   Active Member of [employer] or Organizations: Yes   Attends Archivist Meetings: Never   Marital Status: Married    Tobacco Counseling Counseling given: Not Answered   Clinical Intake:                 Diabetic?no        Activities of Daily Living In your present state of health,  do you have any difficulty performing the following activities: 09/25/2020  Hearing? N  Vision? N  Difficulty concentrating or making decisions? N  Walking or climbing stairs? N  Dressing or bathing? N  Doing errands, shopping? N  Preparing Food and eating ? N  Using the Toilet? N  In the past six months, have you accidently leaked urine? N  Do you have problems with loss of bowel control? N  Managing your Medications? N  Managing your Finances? N  Housekeeping or managing your Housekeeping? N  Some recent data might be hidden    Patient Care Team: Panosh, Standley Brooking, MD as PCP - General (Internal Medicine) Nahser, Wonda Cheng, MD as PCP - Cardiology (Cardiology) Clent Jacks, MD as Consulting Physician (Ophthalmology) Cameron Sprang, MD as Consulting Physician (Neurology)  Indicate any recent Medical Services you may have received from other than [facility] providers in the past year (date may be approximate).     Assessment:   This is a routine wellness examination for Destiny Cruz.  Hearing/Vision screen No results found.  Dietary issues and exercise activities discussed:     Goals Addressed   None    Depression Screen PHQ 2/9 Scores 09/25/2020 02/06/2018 03/31/2016  PHQ - 2 Score 0 0 0  PHQ- 9 Score 0 - -    Fall Risk Fall Risk  09/25/2020 04/16/2019 02/06/2018 03/31/2016  Falls in the past year? 0 0 No No  Number falls in past yr: 0 0 - -  Injury with Fall? 0 0 - -  Risk for fall due to : No Fall Risks - - -  Follow up Falls evaluation completed;Falls prevention discussed - - -    FALL RISK PREVENTION PERTAINING TO THE HOME:  Any stairs in or around the home? No  If so, are there any without handrails? No  Home free of loose throw rugs in walkways, pet beds, electrical cords, etc? Yes  Adequate lighting in your home to reduce risk of falls? Yes   ASSISTIVE DEVICES UTILIZED TO PREVENT FALLS:  Life alert? No  Use of a cane, walker or w/c? No  Grab bars in the bathroom? No   Shower chair or bench in shower? No  Elevated toilet seat or a handicapped toilet? No   TIMED UP AND GO:  Was the test performed? Yes .  Length of time to ambulate 10 feet: 7 sec.   Gait steady and fast without use of assistive device  Cognitive Function:    Normal cognitive status assessed  by direct observation by this Nurse Health Advisor. No abnormalities found.      Immunizations Immunization History  Administered Date(s) Administered   Influenza, High Dose Seasonal PF 08/28/2016, 08/23/2017, 08/23/2018, 08/23/2018, 08/08/2019, 08/09/2020   Influenza-Unspecified 08/08/2019   Moderna Sars-Covid-2 Vaccination 11/05/2020   PFIZER(Purple Top)SARS-COV-2 Vaccination 01/10/2020, 02/05/2020   Pneumococcal Conjugate-13 12/03/2015   Pneumococcal Polysaccharide-23 12/09/2016   Td 03/16/2018   Tdap 03/16/2018    TDAP status: Up to date  Flu Vaccine status: Up to date  Pneumococcal vaccine status: Up to date  Covid-19 vaccine status: Completed vaccines  Qualifies for Shingles Vaccine? Yes   Zostavax completed No   Shingrix Completed?: No.    Education has been provided regarding the importance of this vaccine. Patient has been advised to call insurance company to determine out of pocket expense if they have not yet received this vaccine. Advised may also receive vaccine at local pharmacy or Health Dept. Verbalized acceptance and understanding.  Screening Tests Health Maintenance  Topic Date Due   Zoster Vaccines- Shingrix (1 of 2) Never done   COLONOSCOPY (Pts 45-43yr Insurance coverage will need to be confirmed)  Never done   COVID-19 Vaccine (4 - Booster) 01/28/2021   INFLUENZA VACCINE  07/06/2021   MAMMOGRAM  04/15/2022   TETANUS/TDAP  03/16/2028   DEXA SCAN  Completed   Hepatitis C Screening  Completed   PNA vac Low Risk Adult  Completed   HPV VACCINES  Aged Out    Health Maintenance  Health Maintenance Due  Topic Date Due   Zoster Vaccines- Shingrix (1 of 2)  Never done   COLONOSCOPY (Pts 45-4102yrInsurance coverage will need to be confirmed)  Never done   COVID-19 Vaccine (4 - Booster) 01/28/2021   INFLUENZA VACCINE  07/06/2021    Colorectal cancer screening: Type of screening: Cologuard. Completed 02/21/2018. Repeat every 3 years  Mammogram status: Completed 04/15/2021. Repeat every year  Bone Density status: Ordered 08/13/2021. Pt provided with contact info and advised to call to schedule appt.  Lung Cancer Screening: (Low Dose CT Chest recommended if Age 72-80ears, 30 pack-year currently smoking OR have quit w/in 15years.) does not qualify.   Lung Cancer Screening Referral: n/a  Additional Screening:  Hepatitis C Screening: does not qualify; Completed 11/09/2016  Vision Screening: Recommended annual ophthalmology exams for early detection of glaucoma and other disorders of the eye. Is the patient up to date with their annual eye exam?  Yes  Who is the provider or what is the name of the office in which the patient attends annual eye exams? Dr.Grote If pt is not established with a provider, would they like to be referred to a provider to establish care? No .   Dental Screening: Recommended annual dental exams for proper oral hygiene  Community Resource Referral / Chronic Care Management: CRR required this visit?  No   CCM required this visit?  No      Plan:     I have personally reviewed and noted the following in the patient's chart:   Medical and social history Use of alcohol, tobacco or illicit drugs  Current medications and supplements including opioid prescriptions.  Functional ability and status Nutritional status Physical activity Advanced directives List of other physicians Hospitalizations, surgeries, and ER visits in previous 12 months Vitals Screenings to include cognitive, depression, and falls Referrals and appointments  In addition, I have reviewed and discussed with patient certain preventive  protocols, quality metrics, and best practice recommendations. A written  personalized care plan for preventive services as well as general preventive health recommendations were provided to patient.     Randel Pigg, LPN   3/0/1237   Nurse Notes: none

## 2021-09-21 ENCOUNTER — Encounter: Payer: Self-pay | Admitting: Internal Medicine

## 2021-09-21 ENCOUNTER — Other Ambulatory Visit: Payer: Self-pay

## 2021-09-21 ENCOUNTER — Ambulatory Visit (INDEPENDENT_AMBULATORY_CARE_PROVIDER_SITE_OTHER): Payer: PPO | Admitting: Internal Medicine

## 2021-09-21 VITALS — BP 144/62 | HR 59 | Temp 98.5°F | Ht 61.0 in | Wt 110.4 lb

## 2021-09-21 DIAGNOSIS — K900 Celiac disease: Secondary | ICD-10-CM

## 2021-09-21 DIAGNOSIS — R911 Solitary pulmonary nodule: Secondary | ICD-10-CM

## 2021-09-21 DIAGNOSIS — M810 Age-related osteoporosis without current pathological fracture: Secondary | ICD-10-CM | POA: Diagnosis not present

## 2021-09-21 DIAGNOSIS — Z79899 Other long term (current) drug therapy: Secondary | ICD-10-CM

## 2021-09-21 DIAGNOSIS — E559 Vitamin D deficiency, unspecified: Secondary | ICD-10-CM | POA: Diagnosis not present

## 2021-09-21 DIAGNOSIS — H6123 Impacted cerumen, bilateral: Secondary | ICD-10-CM | POA: Diagnosis not present

## 2021-09-21 DIAGNOSIS — Z0000 Encounter for general adult medical examination without abnormal findings: Secondary | ICD-10-CM | POA: Diagnosis not present

## 2021-09-21 DIAGNOSIS — E782 Mixed hyperlipidemia: Secondary | ICD-10-CM | POA: Diagnosis not present

## 2021-09-21 DIAGNOSIS — Z8632 Personal history of gestational diabetes: Secondary | ICD-10-CM

## 2021-09-21 DIAGNOSIS — I10 Essential (primary) hypertension: Secondary | ICD-10-CM

## 2021-09-21 LAB — BASIC METABOLIC PANEL
BUN: 14 mg/dL (ref 6–23)
CO2: 29 mEq/L (ref 19–32)
Calcium: 9.4 mg/dL (ref 8.4–10.5)
Chloride: 106 mEq/L (ref 96–112)
Creatinine, Ser: 0.96 mg/dL (ref 0.40–1.20)
GFR: 59.32 mL/min — ABNORMAL LOW (ref >60.00)
Glucose, Bld: 110 mg/dL — ABNORMAL HIGH (ref 70–99)
Potassium: 4.1 mEq/L (ref 3.5–5.1)
Sodium: 143 mEq/L (ref 135–145)

## 2021-09-21 LAB — CBC WITH DIFFERENTIAL/PLATELET
Basophils Absolute: 0.1 10*3/uL (ref 0.0–0.1)
Basophils Relative: 1 % (ref 0.0–3.0)
Eosinophils Absolute: 0.2 10*3/uL (ref 0.0–0.7)
Eosinophils Relative: 3.2 % (ref 0.0–5.0)
HCT: 40.7 % (ref 36.0–46.0)
Hemoglobin: 13.5 g/dL (ref 12.0–15.0)
Lymphocytes Relative: 21.9 % (ref 12.0–46.0)
Lymphs Abs: 1.2 10*3/uL (ref 0.7–4.0)
MCHC: 33.2 g/dL (ref 30.0–36.0)
MCV: 90.9 fl (ref 78.0–100.0)
Monocytes Absolute: 0.6 10*3/uL (ref 0.1–1.0)
Monocytes Relative: 10.3 % (ref 3.0–12.0)
Neutro Abs: 3.5 10*3/uL (ref 1.4–7.7)
Neutrophils Relative %: 63.6 % (ref 43.0–77.0)
Platelets: 170 10*3/uL (ref 150.0–400.0)
RBC: 4.48 Mil/uL (ref 3.87–5.11)
RDW: 12.7 % (ref 11.5–15.5)
WBC: 5.6 10*3/uL (ref 4.0–10.5)

## 2021-09-21 LAB — HEPATIC FUNCTION PANEL
ALT: 14 U/L (ref 0–35)
AST: 17 U/L (ref 0–37)
Albumin: 4.5 g/dL (ref 3.5–5.2)
Alkaline Phosphatase: 70 U/L (ref 39–117)
Bilirubin, Direct: 0.1 mg/dL (ref 0.0–0.3)
Total Bilirubin: 0.3 mg/dL (ref 0.2–1.2)
Total Protein: 6.9 g/dL (ref 6.0–8.3)

## 2021-09-21 LAB — TSH: TSH: 1.4 u[IU]/mL (ref 0.35–5.50)

## 2021-09-21 LAB — HEMOGLOBIN A1C: Hgb A1c MFr Bld: 5.4 % (ref 4.6–6.5)

## 2021-09-21 LAB — VITAMIN D 25 HYDROXY (VIT D DEFICIENCY, FRACTURES): VITD: 88.51 ng/mL (ref 30.00–100.00)

## 2021-09-21 NOTE — Progress Notes (Signed)
Chief Complaint  Patient presents with   Annual Exam     HPI: Patient  Destiny Cruz  70 y.o. comes in today for Preventive Health Care visit  Blood pressures been doing well at home in the 120+ range.  On amlodipine daily. Sees cardiology now Dr. Debara Pickett lipid management.  Taking Crestor. Is not ready for: Cancer evaluation also had a positive Cologuard and father had colon cancer. She has celiac disease and has dietary changes avoids milk products doing well.  Is covered when her child was diagnosed with celiac and she had the gene. Gets wax clogged in your ears because of very small ear canals had to be flushed out and treated a number of years ago left ear has intermittent decreased hearing please check your ears and flush out as possible. Health Maintenance  Topic Date Due   Zoster Vaccines- Shingrix (1 of 2) Never done   COLONOSCOPY (Pts 45-24yr Insurance coverage will need to be confirmed)  Never done   COVID-19 Vaccine (4 - Booster) 01/28/2021   MAMMOGRAM  04/15/2022   TETANUS/TDAP  03/16/2028   INFLUENZA VACCINE  Completed   DEXA SCAN  Completed   Hepatitis C Screening  Completed   HPV VACCINES  Aged Out   Health Maintenance Review LIFESTYLE:  Exercise:  active  Tobacco/ETS: Alcohol: wine some Sugar beverages: Sleep: 7-9 Drug use: no HH of  3 no pets  Caretakes for  elderly mom  frial RA      ROS:  GREST of 12 system review negative except as per HPI   Past Medical History:  Diagnosis Date   Chicken pox    Duodenal ulcer    clinical dx when younger responded to med and no recurrance no bleeding   Gestational diabetes    Gluten intolerance    possible celiac  see text    History of scarlatina    Hx of allergy    as a child hay fever    Migraines    UTI (lower urinary tract infection)    once     Past Surgical History:  Procedure Laterality Date   CESAREAN SECTION  [id number]   Family History  Problem Relation Age of Onset   Colon  cancer Father    Stroke Father    Rheum arthritis Mother    Thyroid disease Daughter        partial thyroidectomy    Celiac disease Son        probable  Mom RA    Social History   Socioeconomic History   Marital status: Married    Spouse name: Not on file   Number of children: Not on file   Years of education: Not on file   Highest education level: Not on file  Occupational History   Occupation: Retired  Tobacco Use   Smoking status: Never   Smokeless tobacco: Never  Vaping Use   Vaping Use: Never used  Substance and Sexual Activity   Alcohol use: Not Currently    Alcohol/week: 0.0 standard drinks    Comment: 1-2 Glasses of wine per day   Drug use: No   Sexual activity: Yes    Partners: Male  Other Topics Concern   Not on file  Social History Narrative   Retired on 10/06/2015   Lives her husband and mother   Masters science degree  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    [employer]   Mom has rheumatoid arthritis  Social Determinants of Health   Financial Resource Strain: Low Risk    Difficulty of Paying Living Expenses: Not hard at all  Food Insecurity: No Food Insecurity   Worried About Charity fundraiser in the Last Year: Never true   [place] in the Last Year: Never true  Transportation Needs: No Transportation Needs   Lack of Transportation (Medical): No   Lack of Transportation (Non-Medical): No  Physical Activity: Sufficiently Active   Days of Exercise per Week: 5 days   Minutes of Exercise per Session: 40 min  Stress: No Stress Concern Present   Feeling of Stress : Not at all  Social Connections: Moderately Isolated   Frequency of Communication with Friends and Family: Three times a week   Frequency of Social Gatherings with Friends and Family: Three times a week   Attends Religious Services: Never   Active Member of Clubs or Organizations: No   Attends Archivist Meetings: Never   Marital Status: Married    Outpatient Medications  Prior to Visit  Medication Sig Dispense Refill   amLODipine (NORVASC) 2.5 MG tablet TAKE ONE TABLET ONCE DAILY 90 tablet 2   Cholecalciferol (VITAMIN D-3 PO) Take 4,000 Units by mouth daily.     rosuvastatin (CRESTOR) 20 MG tablet TAKE 1 TABLET ONCE DAILY. 90 tablet 3   No facility-administered medications prior to visit.     EXAM:  BP (!) 144/62 (BP Location: Left Arm, Patient Position: Sitting, Cuff Size: Normal)   Pulse (!) 59   Temp 98.5 F (36.9 C) (Oral)   Ht 5' 1"  (1.549 m)   Wt 110 lb 6.4 oz (50.1 kg)   SpO2 98%   BMI 20.86 kg/m   Body mass index is 20.86 kg/m. Wt Readings from Last 3 Encounters:  09/21/21 110 lb 6.4 oz (50.1 kg)  08/13/21 108 lb 14.4 oz (49.4 kg)  02/16/21 107 lb (48.5 kg)    Physical Exam: Vital signs reviewed XIP:JASN is a well-developed well-nourished alert cooperative    who appearsr stated age in no acute distress.  HEENT: normocephalic atraumatic , Eyes: PERRL EOM's full, conjunctiva clear,s., Ears: no deformity EAC's right +3 wax TM not visualized left very small ear canal +2 wax partial TM visualization mouth: masked  after irrigation  partial removal   tm partly seen  NECK: supple without masses, or bruits. CHEST/PULM:  Clear to auscultation and percussion breath sounds equal no wheeze , rales or rhonchi. No chest wall deformities or tenderness. Breast: normal by inspection . No dimpling, discharge, masses, tenderness or discharge . CV: PMI is nondisplaced, S1 S2 no gallops, murmurs, rubs. Peripheral pulses are full without delay.No JVD .  ABDOMEN: Bowel sounds normal nontender  No guard or rebound, no hepato splenomegal no CVA tenderness.  Extremtities:  No clubbing cyanosis or edema, no acute joint swelling or redness no focal atrophy NEURO:  Oriented x3, cranial nerves 3-12 appear to be intact, no obvious focal weakness,gait within normal limits no abnormal reflexes or asymmetrical SKIN: No acute rashes normal turgor, color, no bruising  or petechiae. PSYCH: Oriented, good eye contact, no obvious depression anxiety, cognition and judgment appear normal. LN: no cervical axillary inguinal adenopathy  Lab Results  Component Value Date   WBC 5.6 09/21/2021   HGB 13.5 09/21/2021   HCT 40.7 09/21/2021   PLT 170.0 09/21/2021   GLUCOSE 110 (H) 09/21/2021   CHOL 240 (H) 01/26/2018   TRIG 147.0 01/26/2018   HDL 56.20 01/26/2018  LDLCALC 155 (H) 01/26/2018   ALT 14 09/21/2021   AST 17 09/21/2021   NA 143 09/21/2021   K 4.1 09/21/2021   CL 106 09/21/2021   CREATININE 0.96 09/21/2021   BUN 14 09/21/2021   CO2 29 09/21/2021   TSH 1.40 09/21/2021   HGBA1C 5.4 09/21/2021    BP Readings from Last 3 Encounters:  09/21/21 (!) 144/62  08/13/21 132/68  02/16/21 (!) 150/70    Lab plan reviewed with patient   nf after lunch will get lipids assessment   per Dr Debara Pickett   and lipid clinic   ASSESSMENT AND PLAN:  Discussed the following assessment and plan:    ICD-10-CM   1. Visit for preventive health examination  Z00.00     2. Medication management  W29.562 Basic metabolic panel    CBC with Differential/Platelet    Hemoglobin A1c    Hepatic function panel    TSH    VITAMIN D 25 Hydroxy (Vit-D Deficiency, Fractures)    Iron, TIBC and Ferritin Panel    Iron, TIBC and Ferritin Panel    VITAMIN D 25 Hydroxy (Vit-D Deficiency, Fractures)    TSH    Hepatic function panel    Hemoglobin A1c    CBC with Differential/Platelet    Basic metabolic panel    3. Bilateral impacted cerumen  H61.23     4. Mixed hyperlipidemia  Z30.8 Basic metabolic panel    CBC with Differential/Platelet    Hemoglobin A1c    Hepatic function panel    TSH    VITAMIN D 25 Hydroxy (Vit-D Deficiency, Fractures)    Iron, TIBC and Ferritin Panel    Iron, TIBC and Ferritin Panel    VITAMIN D 25 Hydroxy (Vit-D Deficiency, Fractures)    TSH    Hepatic function panel    Hemoglobin A1c    CBC with Differential/Platelet    Basic metabolic panel     5. Essential hypertension  M57 Basic metabolic panel    CBC with Differential/Platelet    Hemoglobin A1c    Hepatic function panel    TSH    VITAMIN D 25 Hydroxy (Vit-D Deficiency, Fractures)    Iron, TIBC and Ferritin Panel    Iron, TIBC and Ferritin Panel    VITAMIN D 25 Hydroxy (Vit-D Deficiency, Fractures)    TSH    Hepatic function panel    Hemoglobin A1c    CBC with Differential/Platelet    Basic metabolic panel    6. Hx gestational diabetes  Q46.96 Basic metabolic panel    CBC with Differential/Platelet    Hemoglobin A1c    Hepatic function panel    TSH    VITAMIN D 25 Hydroxy (Vit-D Deficiency, Fractures)    Iron, TIBC and Ferritin Panel    Iron, TIBC and Ferritin Panel    VITAMIN D 25 Hydroxy (Vit-D Deficiency, Fractures)    TSH    Hepatic function panel    Hemoglobin A1c    CBC with Differential/Platelet    Basic metabolic panel    7. Age related osteoporosis, unspecified pathological fracture presence  E95.2 Basic metabolic panel    CBC with Differential/Platelet    Hemoglobin A1c    Hepatic function panel    TSH    VITAMIN D 25 Hydroxy (Vit-D Deficiency, Fractures)    Iron, TIBC and Ferritin Panel    Iron, TIBC and Ferritin Panel    VITAMIN D 25 Hydroxy (Vit-D Deficiency, Fractures)    TSH  Hepatic function panel    Hemoglobin A1c    CBC with Differential/Platelet    Basic metabolic panel    8. Celiac disease  L45.6 Basic metabolic panel    CBC with Differential/Platelet    Hemoglobin A1c    Hepatic function panel    TSH    VITAMIN D 25 Hydroxy (Vit-D Deficiency, Fractures)    Iron, TIBC and Ferritin Panel    Iron, TIBC and Ferritin Panel    VITAMIN D 25 Hydroxy (Vit-D Deficiency, Fractures)    TSH    Hepatic function panel    Hemoglobin A1c    CBC with Differential/Platelet    Basic metabolic panel    9. Vitamin D deficiency  Y56.3 Basic metabolic panel    CBC with Differential/Platelet    Hemoglobin A1c    Hepatic function panel    TSH     VITAMIN D 25 Hydroxy (Vit-D Deficiency, Fractures)    Iron, TIBC and Ferritin Panel    Iron, TIBC and Ferritin Panel    VITAMIN D 25 Hydroxy (Vit-D Deficiency, Fractures)    TSH    Hepatic function panel    Hemoglobin A1c    CBC with Differential/Platelet    Basic metabolic panel    10. Pulmonary nodule seen on imaging study  R91.1    2 mm low risk    see cac report    Blood pressure up in office but has confirmed it is normal at home on treatment Still delaying putting off colon cancer screening. Discussed earwax maintenance may be softening for a week and then using some home techniques or come back to the office.  If all else fails we can get ENT to see her but I do not think that is needed. Consider  Shingrix ( hx of shingles in past)   Return for depending on results yearly.  Patient Care Team: Trampas Stettner, Standley Brooking, MD as PCP - General (Internal Medicine) Nahser, Wonda Cheng, MD as PCP - Cardiology (Cardiology) Clent Jacks, MD as Consulting Physician (Ophthalmology) Cameron Sprang, MD as Consulting Physician (Neurology) Debara Pickett Nadean Corwin, MD as Consulting Physician (Cardiology) Patient Instructions  Good to see you today .  Make sure BP is controlled at home   .  Will notify you  of labs when available.   Continue lifestyle intervention healthy eating and exercise . And fu with Cardiology.   Consider  shingrix  vaccine at your pharmacy if not already done.  Consider ocassional ear wax softening drops  at night  to make wax removal easier.    Health Maintenance, Female Adopting a healthy lifestyle and getting preventive care are important in promoting health and wellness. Ask your health care provider about: The right schedule for you to have regular tests and exams. Things you can do on your own to prevent diseases and keep yourself healthy. What should I know about diet, weight, and exercise? Eat a healthy diet  Eat a diet that includes plenty of vegetables, fruits,  low-fat dairy products, and lean protein. Do not eat a lot of foods that are high in solid fats, added sugars, or sodium. Maintain a healthy weight Body mass index (BMI) is used to identify weight problems. It estimates body fat based on height and weight. Your health care provider can help determine your BMI and help you achieve or maintain a healthy weight. Get regular exercise Get regular exercise. This is one of the most important things you can do for your health. Most  adults should: Exercise for at least 150 minutes each week. The exercise should increase your heart rate and make you sweat (moderate-intensity exercise). Do strengthening exercises at least twice a week. This is in addition to the moderate-intensity exercise. Spend less time sitting. Even light physical activity can be beneficial. Watch cholesterol and blood lipids Have your blood tested for lipids and cholesterol at 72 years of age, then have this test every 5 years. Have your cholesterol levels checked more often if: Your lipid or cholesterol levels are high. You are older than 72 years of age. You are at high risk for heart disease. What should I know about cancer screening? Depending on your health history and family history, you may need to have cancer screening at various ages. This may include screening for: Breast cancer. Cervical cancer. Colorectal cancer. Skin cancer. Lung cancer. What should I know about heart disease, diabetes, and high blood pressure? Blood pressure and heart disease High blood pressure causes heart disease and increases the risk of stroke. This is more likely to develop in people who have high blood pressure readings, are of African descent, or are overweight. Have your blood pressure checked: Every 3-5 years if you are 63-53 years of age. Every year if you are 66 years old or older. Diabetes Have regular diabetes screenings. This checks your fasting blood sugar level. Have the  screening done: Once every three years after age 74 if you are at a normal weight and have a low risk for diabetes. More often and at a younger age if you are overweight or have a high risk for diabetes. What should I know about preventing infection? Hepatitis B If you have a higher risk for hepatitis B, you should be screened for this virus. Talk with your health care provider to find out if you are at risk for hepatitis B infection. Hepatitis C Testing is recommended for: Everyone born from [birth year] through 1967. Anyone with known risk factors for hepatitis C. Sexually transmitted infections (STIs) Get screened for STIs, including gonorrhea and chlamydia, if: You are sexually active and are younger than 72 years of age. You are older than 72 years of age and your health care provider tells you that you are at risk for this type of infection. Your sexual activity has changed since you were last screened, and you are at increased risk for chlamydia or gonorrhea. Ask your health care provider if you are at risk. Ask your health care provider about whether you are at high risk for HIV. Your health care provider may recommend a prescription medicine to help prevent HIV infection. If you choose to take medicine to prevent HIV, you should first get tested for HIV. You should then be tested every 3 months for as long as you are taking the medicine. Pregnancy If you are about to stop having your period (premenopausal) and you may become pregnant, seek counseling before you get pregnant. Take 400 to 800 micrograms (mcg) of folic acid every day if you become pregnant. Ask for birth control (contraception) if you want to prevent pregnancy. Osteoporosis and menopause Osteoporosis is a disease in which the bones lose minerals and strength with aging. This can result in bone fractures. If you are 45 years old or older, or if you are at risk for osteoporosis and fractures, ask your health care provider if you  should: Be screened for bone loss. Take a calcium or vitamin D supplement to lower your risk of fractures. Be given hormone  replacement therapy (HRT) to treat symptoms of menopause. Follow these instructions at home: Lifestyle Do not use any products that contain nicotine or tobacco, such as cigarettes, e-cigarettes, and chewing tobacco. If you need help quitting, ask your health care provider. Do not use street drugs. Do not share needles. Ask your health care provider for help if you need support or information about quitting drugs. Alcohol use Do not drink alcohol if: Your health care provider tells you not to drink. You are pregnant, may be pregnant, or are planning to become pregnant. If you drink alcohol: Limit how much you use to 0-1 drink a day. Limit intake if you are breastfeeding. Be aware of how much alcohol is in your drink. In the [place] one drink equals one 12 oz bottle of beer (355 mL), one 5 oz glass of wine (148 mL), or one 1 oz glass of hard liquor (44 mL). General instructions Schedule regular health, dental, and eye exams. Stay current with your vaccines. Tell your health care provider if: You often feel depressed. You have ever been abused or do not feel safe at home. Summary Adopting a healthy lifestyle and getting preventive care are important in promoting health and wellness. Follow your health care provider's instructions about healthy diet, exercising, and getting tested or screened for diseases. Follow your health care provider's instructions on monitoring your cholesterol and blood pressure. This information is not intended to replace advice given to you by your health care provider. Make sure you discuss any questions you have with your health care provider. Document Revised: 01/30/2021 Document Reviewed: 11/15/2018 Elsevier Patient Education  2022 [employer]. Styles Fambro M.D.

## 2021-09-21 NOTE — Patient Instructions (Addendum)
Good to see you today .  Make sure BP is controlled at home   .  Will notify you  of labs when available.   Continue lifestyle intervention healthy eating and exercise . And fu with Cardiology.   Consider  shingrix  vaccine at your pharmacy if not already done.  Consider ocassional ear wax softening drops  at night  to make wax removal easier.    Health Maintenance, Female Adopting a healthy lifestyle and getting preventive care are important in promoting health and wellness. Ask your health care provider about: The right schedule for you to have regular tests and exams. Things you can do on your own to prevent diseases and keep yourself healthy. What should I know about diet, weight, and exercise? Eat a healthy diet  Eat a diet that includes plenty of vegetables, fruits, low-fat dairy products, and lean protein. Do not eat a lot of foods that are high in solid fats, added sugars, or sodium. Maintain a healthy weight Body mass index (BMI) is used to identify weight problems. It estimates body fat based on height and weight. Your health care provider can help determine your BMI and help you achieve or maintain a healthy weight. Get regular exercise Get regular exercise. This is one of the most important things you can do for your health. Most adults should: Exercise for at least 150 minutes each week. The exercise should increase your heart rate and make you sweat (moderate-intensity exercise). Do strengthening exercises at least twice a week. This is in addition to the moderate-intensity exercise. Spend less time sitting. Even light physical activity can be beneficial. Watch cholesterol and blood lipids Have your blood tested for lipids and cholesterol at 72 years of age, then have this test every 5 years. Have your cholesterol levels checked more often if: Your lipid or cholesterol levels are high. You are older than 72 years of age. You are at high risk for heart disease. What  should I know about cancer screening? Depending on your health history and family history, you may need to have cancer screening at various ages. This may include screening for: Breast cancer. Cervical cancer. Colorectal cancer. Skin cancer. Lung cancer. What should I know about heart disease, diabetes, and high blood pressure? Blood pressure and heart disease High blood pressure causes heart disease and increases the risk of stroke. This is more likely to develop in people who have high blood pressure readings, are of African descent, or are overweight. Have your blood pressure checked: Every 3-5 years if you are 77-27 years of age. Every year if you are 64 years old or older. Diabetes Have regular diabetes screenings. This checks your fasting blood sugar level. Have the screening done: Once every three years after age 5 if you are at a normal weight and have a low risk for diabetes. More often and at a younger age if you are overweight or have a high risk for diabetes. What should I know about preventing infection? Hepatitis B If you have a higher risk for hepatitis B, you should be screened for this virus. Talk with your health care provider to find out if you are at risk for hepatitis B infection. Hepatitis C Testing is recommended for: Everyone born from [birth year] through 1967. Anyone with known risk factors for hepatitis C. Sexually transmitted infections (STIs) Get screened for STIs, including gonorrhea and chlamydia, if: You are sexually active and are younger than 72 years of age. You are older than 72  years of age and your health care provider tells you that you are at risk for this type of infection. Your sexual activity has changed since you were last screened, and you are at increased risk for chlamydia or gonorrhea. Ask your health care provider if you are at risk. Ask your health care provider about whether you are at high risk for HIV. Your health care provider may recommend  a prescription medicine to help prevent HIV infection. If you choose to take medicine to prevent HIV, you should first get tested for HIV. You should then be tested every 3 months for as long as you are taking the medicine. Pregnancy If you are about to stop having your period (premenopausal) and you may become pregnant, seek counseling before you get pregnant. Take 400 to 800 micrograms (mcg) of folic acid every day if you become pregnant. Ask for birth control (contraception) if you want to prevent pregnancy. Osteoporosis and menopause Osteoporosis is a disease in which the bones lose minerals and strength with aging. This can result in bone fractures. If you are 55 years old or older, or if you are at risk for osteoporosis and fractures, ask your health care provider if you should: Be screened for bone loss. Take a calcium or vitamin D supplement to lower your risk of fractures. Be given hormone replacement therapy (HRT) to treat symptoms of menopause. Follow these instructions at home: Lifestyle Do not use any products that contain nicotine or tobacco, such as cigarettes, e-cigarettes, and chewing tobacco. If you need help quitting, ask your health care provider. Do not use street drugs. Do not share needles. Ask your health care provider for help if you need support or information about quitting drugs. Alcohol use Do not drink alcohol if: Your health care provider tells you not to drink. You are pregnant, may be pregnant, or are planning to become pregnant. If you drink alcohol: Limit how much you use to 0-1 drink a day. Limit intake if you are breastfeeding. Be aware of how much alcohol is in your drink. In the [place] one drink equals one 12 oz bottle of beer (355 mL), one 5 oz glass of wine (148 mL), or one 1 oz glass of hard liquor (44 mL). General instructions Schedule regular health, dental, and eye exams. Stay current with your vaccines. Tell your health care provider if: You  often feel depressed. You have ever been abused or do not feel safe at home. Summary Adopting a healthy lifestyle and getting preventive care are important in promoting health and wellness. Follow your health care provider's instructions about healthy diet, exercising, and getting tested or screened for diseases. Follow your health care provider's instructions on monitoring your cholesterol and blood pressure. This information is not intended to replace advice given to you by your health care provider. Make sure you discuss any questions you have with your health care provider. Document Revised: 01/30/2021 Document Reviewed: 11/15/2018 Elsevier Patient Education  2022 [employer].

## 2021-09-22 LAB — IRON,TIBC AND FERRITIN PANEL
%SAT: 43 % (calc) (ref 16–45)
Ferritin: 127 ng/mL (ref 16–288)
Iron: 130 ug/dL (ref 45–160)
TIBC: 305 mcg/dL (calc) (ref 250–450)

## 2021-09-25 NOTE — Progress Notes (Signed)
Results stable  no diabetes , vit d  thyroid, liver blood count normal

## 2022-01-09 ENCOUNTER — Encounter: Payer: Self-pay | Admitting: Internal Medicine

## 2022-01-12 ENCOUNTER — Ambulatory Visit (HOSPITAL_BASED_OUTPATIENT_CLINIC_OR_DEPARTMENT_OTHER): Payer: PPO | Admitting: Internal Medicine

## 2022-01-12 MED ORDER — ALPRAZOLAM 0.25 MG PO TABS: 0.2500 mg | ORAL_TABLET | Freq: Two times a day (BID) | ORAL | 0 refills | Status: DC | PRN

## 2022-01-12 NOTE — Telephone Encounter (Signed)
Hard to tell what would be best. Depends on the cause of the rapid heart rate. Inderal can be used for anxiety palpitations and also rapid heart rate and perhaps rapid A-fib so that is not diagnostic.  Would be happy to prescribe a small amount of either alprazolam or lorazepam if we think this could be a panic attack.  However I do think you should get back with cardiology about the symptoms to see if it is appropriate to do a heart monitor noninvasive   And or prescribe propranolol..  Do you want me to send in some lorazepam or alprazolam?

## 2022-01-12 NOTE — Telephone Encounter (Signed)
Noted   I will send in  updated  small amount of alprazolam and you can decide to pick it up.

## 2022-01-13 DIAGNOSIS — H2513 Age-related nuclear cataract, bilateral: Secondary | ICD-10-CM | POA: Diagnosis not present

## 2022-01-13 DIAGNOSIS — H5202 Hypermetropia, left eye: Secondary | ICD-10-CM | POA: Diagnosis not present

## 2022-01-13 DIAGNOSIS — H0288B Meibomian gland dysfunction left eye, upper and lower eyelids: Secondary | ICD-10-CM | POA: Diagnosis not present

## 2022-01-13 DIAGNOSIS — H0288A Meibomian gland dysfunction right eye, upper and lower eyelids: Secondary | ICD-10-CM | POA: Diagnosis not present

## 2022-01-13 DIAGNOSIS — H04123 Dry eye syndrome of bilateral lacrimal glands: Secondary | ICD-10-CM | POA: Diagnosis not present

## 2022-01-18 ENCOUNTER — Encounter: Payer: Self-pay | Admitting: Internal Medicine

## 2022-01-19 MED ORDER — AMLODIPINE BESYLATE 2.5 MG PO TABS: 2.5000 mg | ORAL_TABLET | Freq: Two times a day (BID) | ORAL | 3 refills | Status: DC

## 2022-01-28 ENCOUNTER — Ambulatory Visit
Admission: RE | Admit: 2022-01-28 | Discharge: 2022-01-28 | Disposition: A | Discharge status: Home / Self Care | Payer: PPO | Source: Ambulatory Visit | Attending: Internal Medicine | Admitting: Internal Medicine

## 2022-01-28 DIAGNOSIS — M810 Age-related osteoporosis without current pathological fracture: Secondary | ICD-10-CM | POA: Diagnosis not present

## 2022-01-28 DIAGNOSIS — Z780 Asymptomatic menopausal state: Secondary | ICD-10-CM

## 2022-02-01 NOTE — Progress Notes (Signed)
So bone density is down to -4.5 in your lumbar spine, from -3.5 and your both hip regions down to -3.8 Both of these are significant drop since 2017. I strongly suggest reconsider getting on additional therapy and can  see endo specialist to discuss questions, options  First-line  is still bisphosphonate and I dont recall what side effects you may have had in past.

## 2022-02-02 IMAGING — MG DIGITAL SCREENING BILAT W/ TOMO W/ CAD
6 of 12 series · 6 of 36 positions shown · non-contrast
Comparison: Previous exam(s).

CLINICAL DATA: Screening.

EXAM:
DIGITAL SCREENING BILATERAL MAMMOGRAM WITH TOMO AND CAD

[R MLO synth-2D (1 of 2)]
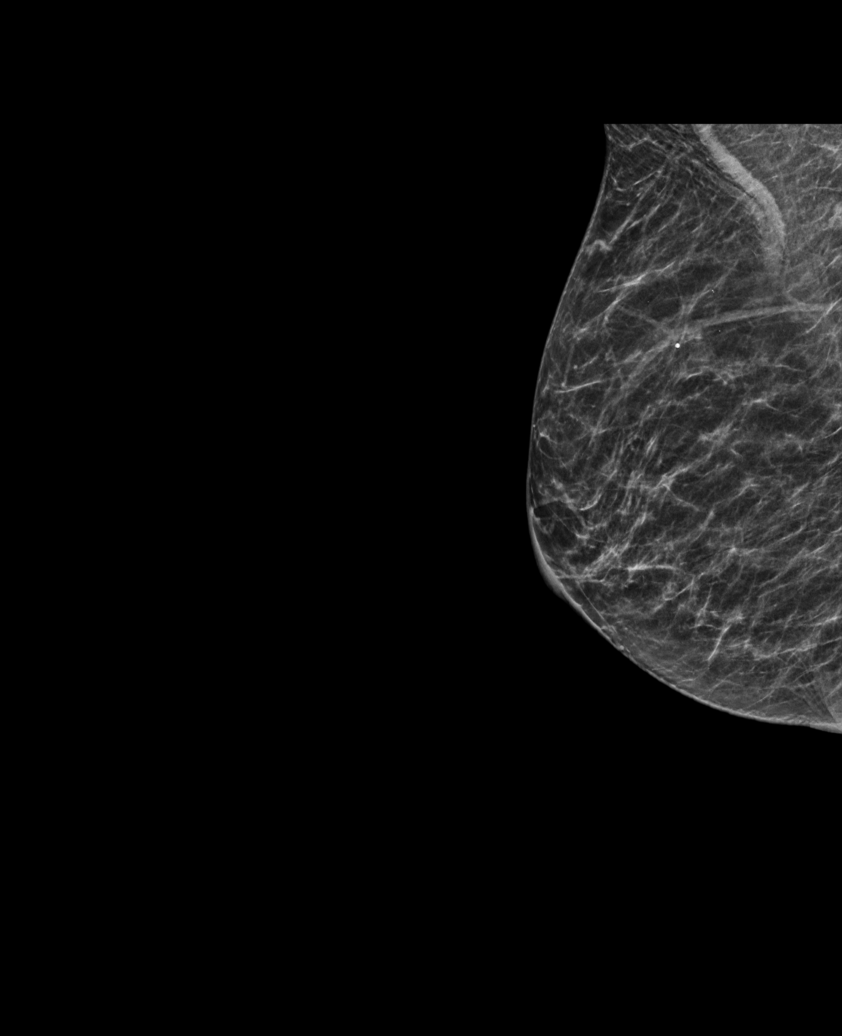

[L MLO synth-2D (1 of 2)]
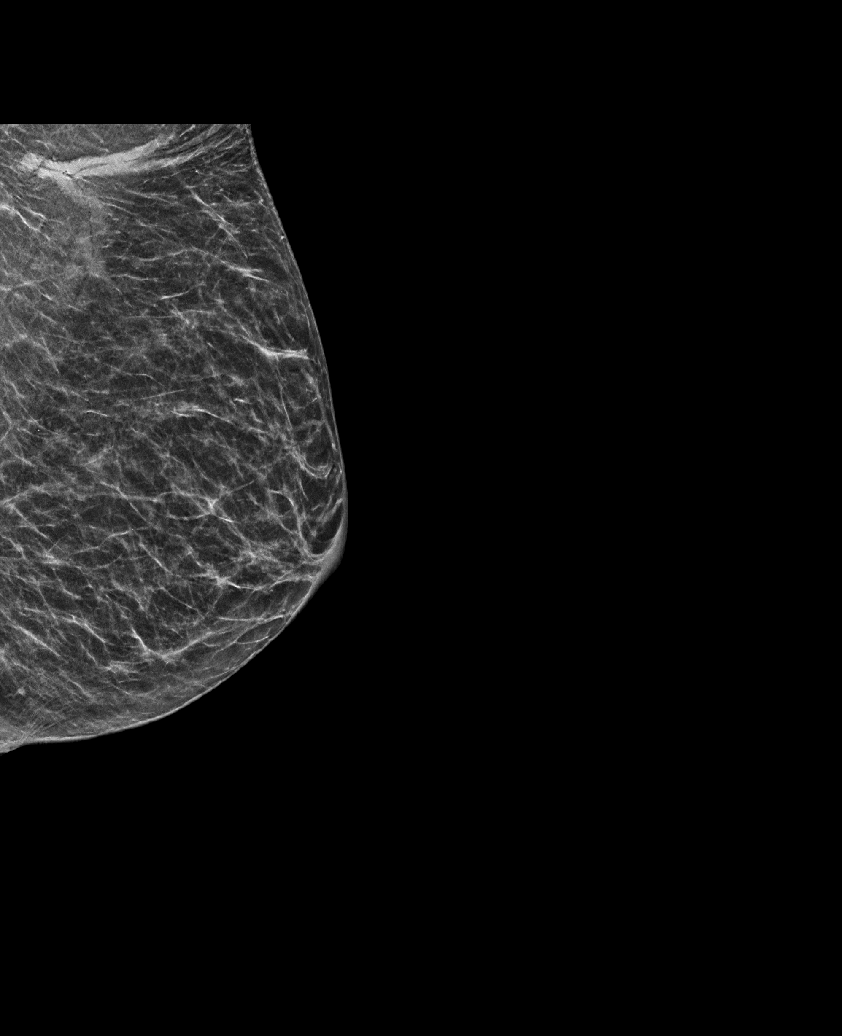

[R CC synth-2D]
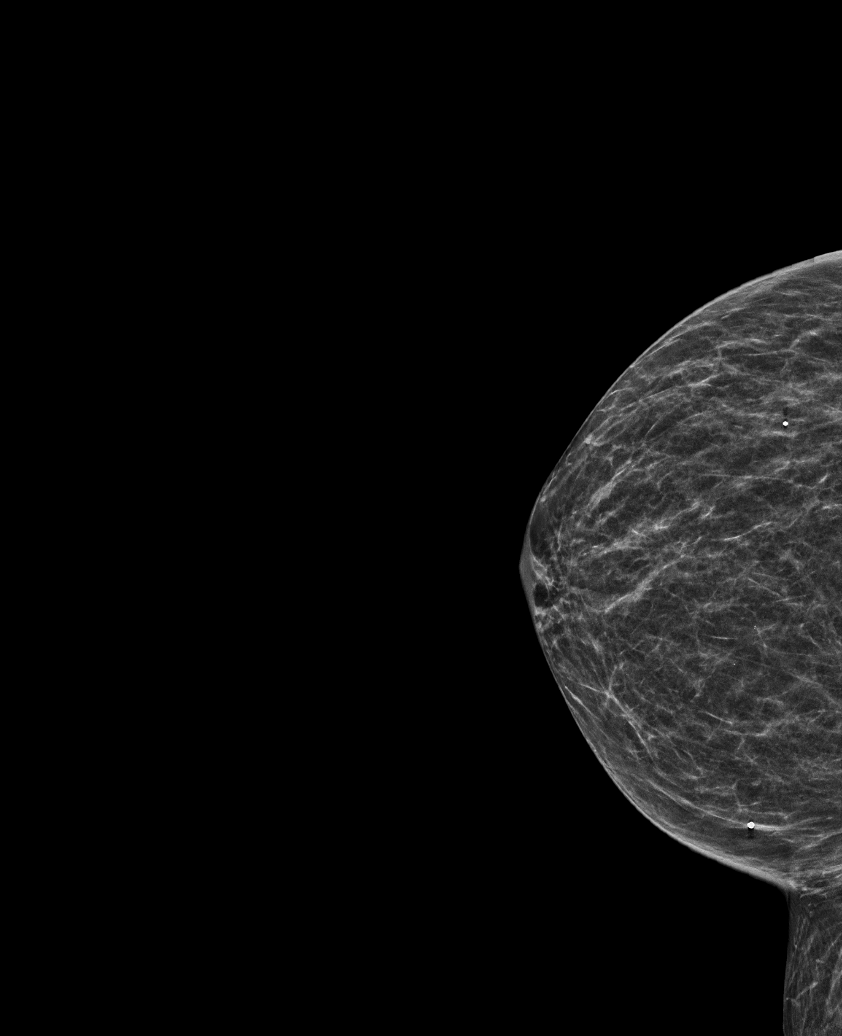

[L MLO synth-2D (2 of 2)]
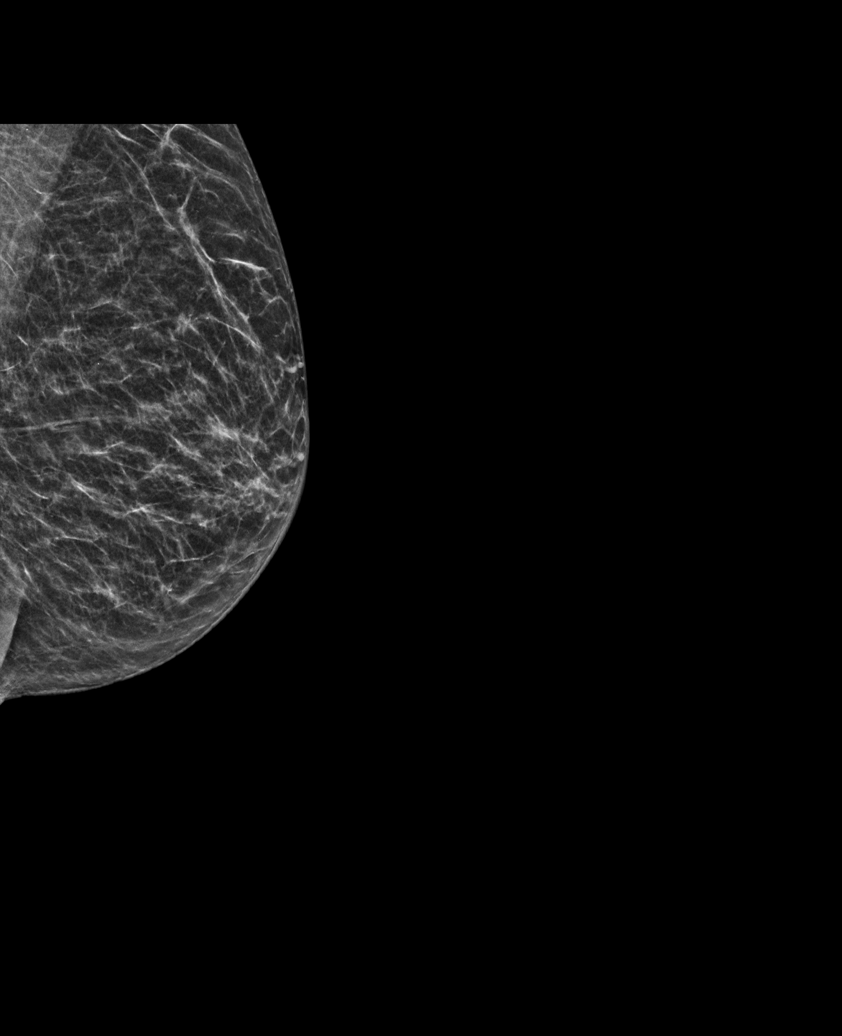

[R MLO synth-2D (2 of 2)]
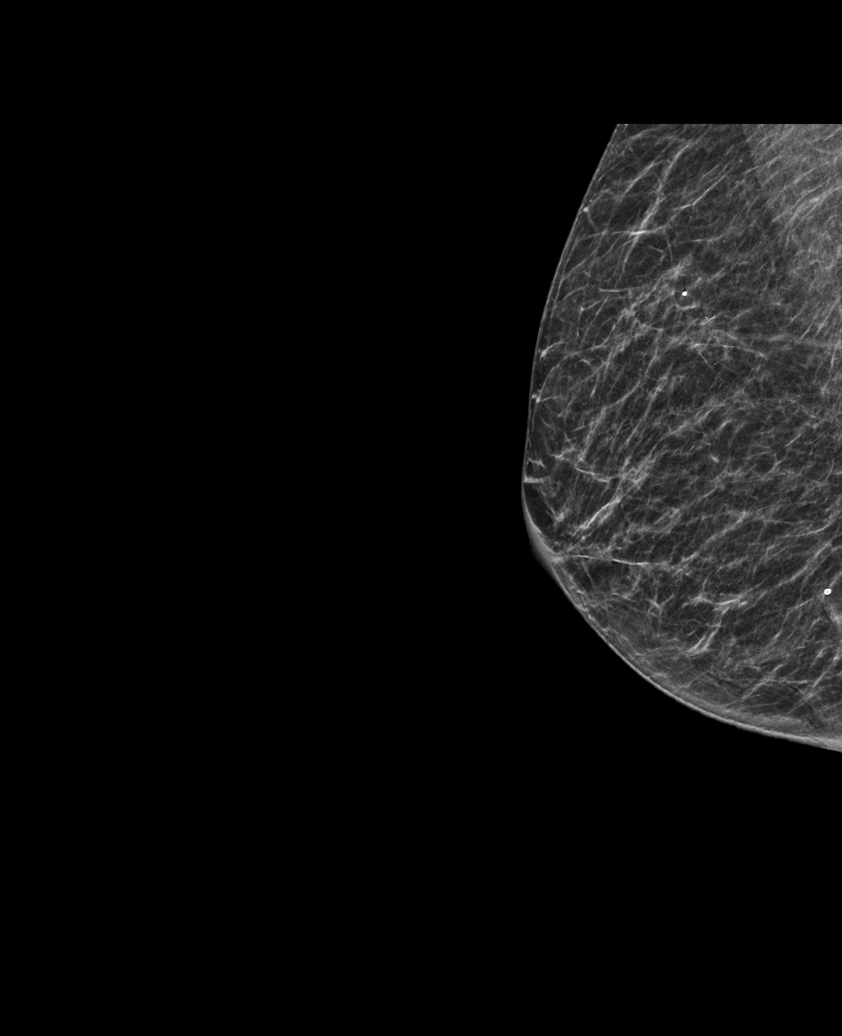

[L CC synth-2D]
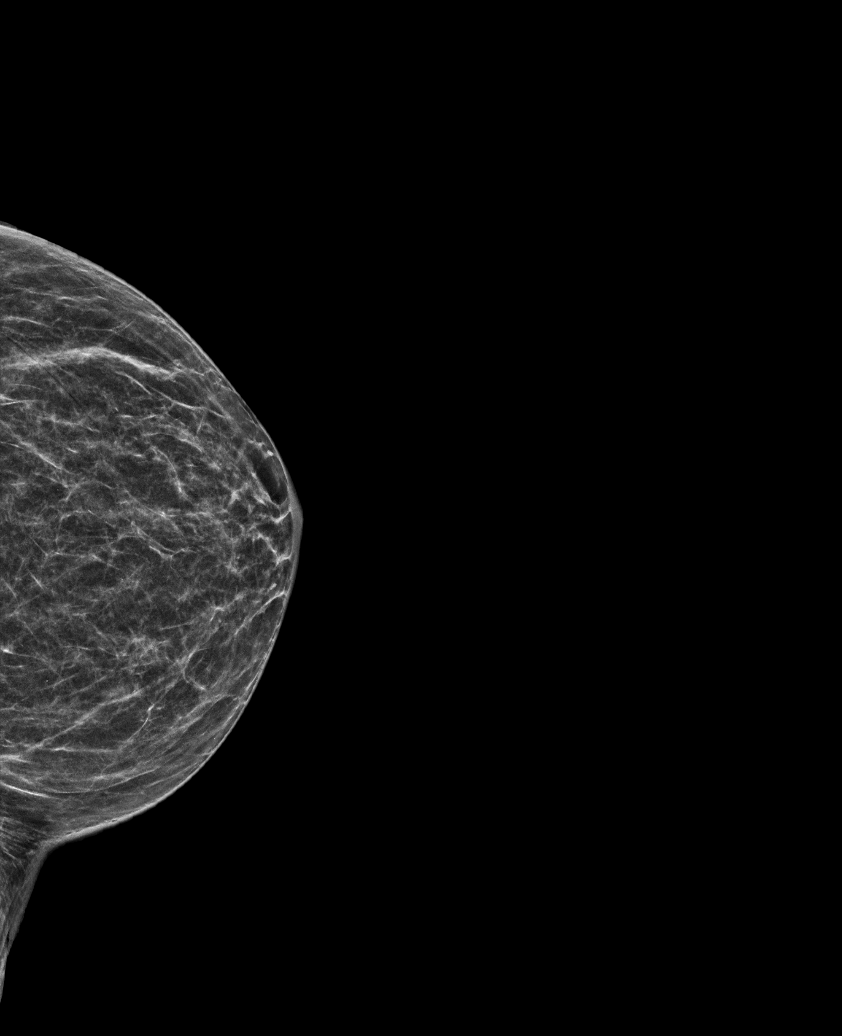

[6 of 36 positions shown; findings below may reference images not displayed]

ACR Breast Density Category b: There are scattered areas of
fibroglandular density.
FINDINGS: There are no findings suspicious for malignancy. Images were
processed with CAD.
IMPRESSION: No mammographic evidence of malignancy. A result letter of this
screening mammogram will be mailed directly to the patient.

RECOMMENDATION:
Screening mammogram in one year. (Code:[id number])

BI-RADS CATEGORY  1: Negative.

## 2022-02-03 ENCOUNTER — Encounter: Payer: Self-pay | Admitting: Internal Medicine

## 2022-02-03 NOTE — Telephone Encounter (Signed)
Thanks for the  documentation  update of opinion.  There are some newer anabolic meds on the  market  for osteoporosis but  I understand reasoning behind decision not to take  meds. . If you ever  get a   vertebral fracture ( hope not!)  take another look into these new meds .  Tell you mom hi.   ?Thanks [facility]

## 2022-02-09 ENCOUNTER — Ambulatory Visit: Payer: PPO | Admitting: Internal Medicine

## 2022-02-15 ENCOUNTER — Encounter: Payer: Self-pay | Admitting: Internal Medicine

## 2022-02-15 DIAGNOSIS — E7800 Pure hypercholesterolemia, unspecified: Secondary | ICD-10-CM | POA: Diagnosis not present

## 2022-02-15 DIAGNOSIS — E782 Mixed hyperlipidemia: Secondary | ICD-10-CM | POA: Diagnosis not present

## 2022-02-15 DIAGNOSIS — I2510 Atherosclerotic heart disease of native coronary artery without angina pectoris: Secondary | ICD-10-CM | POA: Diagnosis not present

## 2022-02-15 DIAGNOSIS — I2584 Coronary atherosclerosis due to calcified coronary lesion: Secondary | ICD-10-CM | POA: Diagnosis not present

## 2022-02-16 LAB — NMR, LIPOPROFILE
Cholesterol, Total: 171 mg/dL (ref 100–199)
HDL Particle Number: 46 umol/L (ref >=30.5)
HDL-C: 78 mg/dL (ref >39)
LDL Particle Number: 757 nmol/L (ref <1000)
LDL Size: 20.6 nm (ref >20.5)
LDL-C (NIH Calc): 76 mg/dL (ref 0–99)
LP-IR Score: 30 (ref <=45)
Small LDL Particle Number: 354 nmol/L (ref <=527)
Triglycerides: 92 mg/dL (ref 0–149)

## 2022-02-22 ENCOUNTER — Ambulatory Visit: Payer: PPO | Admitting: Internal Medicine

## 2022-02-22 ENCOUNTER — Other Ambulatory Visit: Payer: Self-pay

## 2022-02-22 ENCOUNTER — Encounter: Payer: Self-pay | Admitting: Internal Medicine

## 2022-02-22 VITALS — BP 144/72 | HR 58 | Ht 60.6 in | Wt 112.8 lb

## 2022-02-22 DIAGNOSIS — I10 Essential (primary) hypertension: Secondary | ICD-10-CM

## 2022-02-22 DIAGNOSIS — E782 Mixed hyperlipidemia: Secondary | ICD-10-CM

## 2022-02-22 DIAGNOSIS — I2510 Atherosclerotic heart disease of native coronary artery without angina pectoris: Secondary | ICD-10-CM | POA: Diagnosis not present

## 2022-02-22 DIAGNOSIS — I2584 Coronary atherosclerosis due to calcified coronary lesion: Secondary | ICD-10-CM | POA: Diagnosis not present

## 2022-02-22 NOTE — Patient Instructions (Signed)
Medication Instructions:  ?Your physician recommends that you continue on your current medications as directed. Please refer to the Current Medication list given to you today. ? ?*If you need a refill on your cardiac medications before your next appointment, please call your pharmacy* ? ? ?Lab Work: ?FASTING lab work to check cholesterol in 1 year  ? ?If you have labs (blood work) drawn today and your tests are completely normal, you will receive your results only by: ?MyChart Message (if you have MyChart) OR ?A paper copy in the mail ?If you have any lab test that is abnormal or we need to change your treatment, we will call you to review the results. ? ? ?Follow-Up: ?At [facility], you and your health needs are our priority.  As part of our continuing mission to provide you with exceptional heart care, we have created designated Provider Care Teams.  These Care Teams include your primary Cardiologist (physician) and Advanced Practice Providers (APPs -  Physician Assistants and Nurse Practitioners) who all work together to provide you with the care you need, when you need it. ? ?We recommend signing up for the patient portal called "MyChart".  Sign up information is provided on this After Visit Summary.  MyChart is used to connect with patients for Virtual Visits (Telemedicine).  Patients are able to view lab/test results, encounter notes, upcoming appointments, etc.  Non-urgent messages can be sent to your provider as well.   ?To learn more about what you can do with MyChart, go to [email or web address].   ? ?Your next appointment:   ? ?1 year with Dr. Debara Pickett  ? ?

## 2022-02-22 NOTE — Progress Notes (Signed)
? ? ?LIPID CLINIC CONSULT NOTE ? ?Chief Complaint:  ?Follow-up dyslipidemia ? ?Primary Care Physician: ?Burnis Medin, MD ? ?Primary Cardiologist:  ?Pixie Casino, MD ? ?HPI:  ?Destiny Cruz is a 71 y.o. female who is being seen today for the evaluation of dyslipidemia at the request of Panosh, Standley Brooking, MD. This is a pleasant 71 year old female who is a wife of a patient of mine.  She is followed by Dr. Acie Fredrickson and was referred for management of dyslipidemia.  She has a history of recent hypertension now managed on Micardis.  She does have longstanding dyslipidemia.  In fact she brought in a complex graft today which indicates her lipids that have been tracked back to 2002.  She has had persistently elevated LDL-C the lowest being 131 but is high as recently 246 as of August 2020.  Her total cholesterol has been persistently over 200 for years.  Recently she had coronary artery calcium scoring which was elevated at 114, intermediate risk and the 76 percentile based on age and sex matched controls.  She is completely asymptomatic from a cardiac standpoint.  Says she eats a healthy diet which is according to [facility] guidelines will not make any dietary modifications.  Recently she has lost weight and she seems to be comfortable with her current weight which is in range and acceptable BMI of 21.  Physical activity is minimal.  There is a family history of hemorrhagic stroke as well as heart disease which is concerning to her.  And the reason she presented was for further evaluation and management recommendations.  Most recently her lipid profile which was a lipid NMR showed a total cholesterol of 328, triglycerides 92, HDL 64 and LDL-C of 246.  Her LDL-P was 2722 nmol/L and a LPA was normal at 59.  APO B was significantly elevated 176 with an increase in small LDL-P of greater than 800.  Overall a significantly abnormal lipid profile suggesting increased cardiovascular risk. ? ?01/11/2020 ? ?Destiny Cruz returns  today for follow-up.  Overall she is doing extremely well.  She has had significant reduction in her lipids on 20 mg of rosuvastatin but is also lost a lot of weight and changed her diet significantly.  She is down to 107 pounds today.  Her lipid NMR previously showed a LDL-P of 2723, now reduced to 836 particles.  LDL-C is now 81, HDL-C 64, small LDL-P is 316, triglycerides 83.  Overall marked improvement in her lipids and very near goal.  Best overall she is tolerating the statin without any side effects. ? ?01/14/2021 ? ?Destiny Cruz is seen today in follow-up.  Her lipids continue to look very good.  Her total cholesterol is 153, HDL 64, LDL 70 and triglycerides 104.  Blood pressure is well controlled at home although was slightly elevated today.  She is on low-dose amlodipine primarily for Raynaud's and also for her blood pressure.  She is also followed by Dr. Acie Fredrickson. ? ?02/22/2022 ? ?Destiny Cruz returns today for follow-up.  Cholesterol continues to be well treated.  Her LDL-P is 757, LDL-C76, HDL-C78 and triglycerides 92.  Overall an excellent lipid profile in fact the lowest LDL particle number she has had in a number of years.  She is tolerating the Crestor without any significant side effects.  Blood pressure was a little elevated today 144/72 however home readings are lower.  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  She reports that she wishes to  follow along with me since Dr. Acie Fredrickson will be retiring soon. ? ?PMHx:  ?Past Medical History:  ?Diagnosis Date  ? Chicken pox   ? Duodenal ulcer   ? clinical dx when younger responded to med and no recurrance no bleeding  ? Gestational diabetes   ? Gluten intolerance   ? possible celiac  see text   ? History of scarlatina   ? Hx of allergy   ? as a child hay fever   ? Migraines   ? UTI (lower urinary tract infection)   ? once   ? ? ?Past Surgical History:  ?Procedure Laterality Date  ? CESAREAN SECTION  [id number]  ? ? ?FAMHx:  ?Family  History  ?Problem Relation Age of Onset  ? Colon cancer Father   ? Stroke Father   ? Rheum arthritis Mother   ? Thyroid disease Daughter   ?     partial thyroidectomy   ? Celiac disease Son   ?     probable  ? ? ?SOCHx:  ? reports that she has never smoked. She has never used smokeless tobacco. She reports that she does not currently use alcohol. She reports that she does not use drugs. ? ?ALLERGIES:  ?Allergies  ?Allergen Reactions  ? Keflex [Cephalexin] Hives  ? Penicillins Hives  ? Sulfa Antibiotics Hives  ? ? ?ROS: ?Pertinent items noted in HPI and remainder of comprehensive ROS otherwise negative. ? ?HOME MEDS: ?Current Outpatient Medications on File Prior to Visit  ?Medication Sig Dispense Refill  ? ALPRAZolam (XANAX) 0.25 MG tablet Take 1-2 tablets (0.25-0.5 mg total) by mouth 2 (two) times daily as needed for anxiety. 15 tablet 0  ? amLODipine (NORVASC) 2.5 MG tablet Take 1 tablet (2.5 mg total) by mouth 2 (two) times daily. 180 tablet 3  ? Cholecalciferol (VITAMIN D-3 PO) Take 4,000 Units by mouth daily.    ? rosuvastatin (CRESTOR) 20 MG tablet TAKE 1 TABLET ONCE DAILY. 90 tablet 3  ? ?No current facility-administered medications on file prior to visit.  ? ? ?LABS/IMAGING: ?No results found for this or any previous visit (from the past 48 hour(s)). ?No results found. ? ?LIPID PANEL: ?   ?Component Value Date/Time  ? CHOL 240 (H) 01/26/2018 0750  ? TRIG 147.0 01/26/2018 0750  ? HDL 56.20 01/26/2018 0750  ? CHOLHDL 4 01/26/2018 0750  ? VLDL 29.4 01/26/2018 0750  ? LDLCALC 155 (H) 01/26/2018 0750  ? ? ?WEIGHTS: ?Wt Readings from Last 3 Encounters:  ?02/22/22 112 lb 12.8 oz (51.2 kg)  ?09/21/21 110 lb 6.4 oz (50.1 kg)  ?08/13/21 108 lb 14.4 oz (49.4 kg)  ? ? ?VITALS: ?BP (!) 144/72   Pulse (!) 58   Ht 5' 0.6" (1.539 m)   Wt 112 lb 12.8 oz (51.2 kg)   SpO2 98%   BMI 21.60 kg/m?  ? ?EXAM: ?General appearance: alert and no distress ?Neck: no carotid bruit, no JVD and thyroid not enlarged, symmetric, no  tenderness/mass/nodules ?Lungs: clear to auscultation bilaterally ?Heart: regular rate and rhythm ?Extremities: extremities normal, atraumatic, no cyanosis or edema ?Skin: Skin color, texture, turgor normal. No rashes or lesions ?Psych: Pleasant ? ?EKG: ?Sinus bradycardia LAFB at 58 ? ?ASSESSMENT: ?Mixed dyslipidemia, goal LDL less than 70 ?Elevated CAC score 114 (2020)-76th percentile for age and sex matched control ?Hypertension ?LAFB ? ?PLAN: ?1.   Destiny Cruz seems to be doing well without any issues on rosuvastatin.  Her cholesterol is very well controlled.  She denies any chest  pain.  Her blood pressure was a little elevated today and she will monitor that although home readings have been lower.  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  No changes to her meds today. ? ?Follow-up with me in 1 year or sooner as necessary. ? ?Pixie Casino, MD, [facility], FACP  ?[place]  ?Medical Director of the Advanced Lipid Disorders &  ?Cardiovascular Risk Reduction Clinic ?Diplomate of the [employer] of Clinical Lipidology ?Attending Cardiologist  ?Direct Dial: [phone]  Fax: [phone]  ?Website:  [email or web address] ? ?Nadean Corwin Jyron Turman ?02/22/2022, 12:57 PM ?

## 2022-02-23 ENCOUNTER — Encounter: Payer: Self-pay | Admitting: Internal Medicine

## 2022-03-04 ENCOUNTER — Other Ambulatory Visit: Payer: Self-pay | Admitting: Internal Medicine

## 2022-03-04 DIAGNOSIS — Z1231 Encounter for screening mammogram for malignant neoplasm of breast: Secondary | ICD-10-CM

## 2022-03-27 ENCOUNTER — Other Ambulatory Visit: Payer: Self-pay | Admitting: Internal Medicine

## 2022-04-16 ENCOUNTER — Ambulatory Visit
Admission: RE | Admit: 2022-04-16 | Discharge: 2022-04-16 | Disposition: A | Discharge status: Home / Self Care | Payer: PPO | Source: Ambulatory Visit | Attending: Internal Medicine | Admitting: Internal Medicine

## 2022-04-16 DIAGNOSIS — Z1231 Encounter for screening mammogram for malignant neoplasm of breast: Secondary | ICD-10-CM | POA: Diagnosis not present

## 2022-08-25 ENCOUNTER — Ambulatory Visit (INDEPENDENT_AMBULATORY_CARE_PROVIDER_SITE_OTHER): Payer: PPO

## 2022-08-25 VITALS — BP 120/60 | HR 60 | Temp 98.6°F | Ht 66.0 in | Wt 117.6 lb

## 2022-08-25 DIAGNOSIS — Z0000 Encounter for general adult medical examination without abnormal findings: Secondary | ICD-10-CM

## 2022-08-25 NOTE — Patient Instructions (Addendum)
Destiny Cruz , Thank you for taking time to come for your Medicare Wellness Visit. I appreciate your ongoing commitment to your health goals. Please review the following plan we discussed and let me know if I can assist you in the future.   These are the goals we discussed:  Goals       Exercise 150 min/wk Moderate Activity      Stay Healthy (pt-stated)        This is a list of the screening recommended for you and due dates:  Health Maintenance  Topic Date Due   COVID-19 Vaccine (4 - Mixed Product risk series) 12/31/2020   Zoster (Shingles) Vaccine (1 of 2) 11/24/2022*   Flu Shot  03/06/2023*   Colon Cancer Screening  08/26/2023*   Mammogram  04/17/2023   Tetanus Vaccine  03/16/2028   Pneumonia Vaccine  Completed   DEXA scan (bone density measurement)  Completed   Hepatitis C Screening: USPSTF Recommendation to screen - Ages 34-79 yo.  Completed   HPV Vaccine  Aged Out  *Topic was postponed. The date shown is not the original due date.   Advanced directives: Please bring a copy of your health care power of attorney and living will to the office to be added to your chart at your convenience.   Conditions/risks identified: None  Next appointment: Follow up in one year for your annual wellness visit    Preventive Care 65 Years and Older, Female Preventive care refers to lifestyle choices and visits with your health care provider that can promote health and wellness. What does preventive care include? A yearly physical exam. This is also called an annual well check. Dental exams once or twice a year. Routine eye exams. Ask your health care provider how often you should have your eyes checked. Personal lifestyle choices, including: Daily care of your teeth and gums. Regular physical activity. Eating a healthy diet. Avoiding tobacco and drug use. Limiting alcohol use. Practicing safe sex. Taking low-dose aspirin every day. Taking vitamin and mineral supplements as  recommended by your health care provider. What happens during an annual well check? The services and screenings done by your health care provider during your annual well check will depend on your age, overall health, lifestyle risk factors, and family history of disease. Counseling  Your health care provider may ask you questions about your: Alcohol use. Tobacco use. Drug use. Emotional well-being. Home and relationship well-being. Sexual activity. Eating habits. History of falls. Memory and ability to understand (cognition). Work and work Statistician. Reproductive health. Screening  You may have the following tests or measurements: Height, weight, and BMI. Blood pressure. Lipid and cholesterol levels. These may be checked every 5 years, or more frequently if you are over 9 years old. Skin check. Lung cancer screening. You may have this screening every year starting at age 54 if you have a 30-pack-year history of smoking and currently smoke or have quit within the past 15 years. Fecal occult blood test (FOBT) of the stool. You may have this test every year starting at age 27. Flexible sigmoidoscopy or colonoscopy. You may have a sigmoidoscopy every 5 years or a colonoscopy every 10 years starting at age 64. Hepatitis C blood test. Hepatitis B blood test. Sexually transmitted disease (STD) testing. Diabetes screening. This is done by checking your blood sugar (glucose) after you have not eaten for a while (fasting). You may have this done every 1-3 years. Bone density scan. This is done to screen for  osteoporosis. You may have this done starting at age 33. Mammogram. This may be done every 1-2 years. Talk to your health care provider about how often you should have regular mammograms. Talk with your health care provider about your test results, treatment options, and if necessary, the need for more tests. Vaccines  Your health care provider may recommend certain vaccines, such  as: Influenza vaccine. This is recommended every year. Tetanus, diphtheria, and acellular pertussis (Tdap, Td) vaccine. You may need a Td booster every 10 years. Zoster vaccine. You may need this after age 54. Pneumococcal 13-valent conjugate (PCV13) vaccine. One dose is recommended after age 76. Pneumococcal polysaccharide (PPSV23) vaccine. One dose is recommended after age 68. Talk to your health care provider about which screenings and vaccines you need and how often you need them. This information is not intended to replace advice given to you by your health care provider. Make sure you discuss any questions you have with your health care provider. Document Released: 12/19/2015 Document Revised: 08/11/2016 Document Reviewed: 09/23/2015 Elsevier Interactive Patient Education  2017 [employer] Prevention in the Home Falls can cause injuries. They can happen to people of all ages. There are many things you can do to make your home safe and to help prevent falls. What can I do on the outside of my home? Regularly fix the edges of walkways and driveways and fix any cracks. Remove anything that might make you trip as you walk through a door, such as a raised step or threshold. Trim any bushes or trees on the path to your home. Use bright outdoor lighting. Clear any walking paths of anything that might make someone trip, such as rocks or tools. Regularly check to see if handrails are loose or broken. Make sure that both sides of any steps have handrails. Any raised decks and porches should have guardrails on the edges. Have any leaves, snow, or ice cleared regularly. Use sand or salt on walking paths during winter. Clean up any spills in your garage right away. This includes oil or grease spills. What can I do in the bathroom? Use night lights. Install grab bars by the toilet and in the tub and shower. Do not use towel bars as grab bars. Use non-skid mats or decals in the tub or  shower. If you need to sit down in the shower, use a plastic, non-slip stool. Keep the floor dry. Clean up any water that spills on the floor as soon as it happens. Remove soap buildup in the tub or shower regularly. Attach bath mats securely with double-sided non-slip rug tape. Do not have throw rugs and other things on the floor that can make you trip. What can I do in the bedroom? Use night lights. Make sure that you have a light by your bed that is easy to reach. Do not use any sheets or blankets that are too big for your bed. They should not hang down onto the floor. Have a firm chair that has side arms. You can use this for support while you get dressed. Do not have throw rugs and other things on the floor that can make you trip. What can I do in the kitchen? Clean up any spills right away. Avoid walking on wet floors. Keep items that you use a lot in easy-to-reach places. If you need to reach something above you, use a strong step stool that has a grab bar. Keep electrical cords out of the way. Do not  use floor polish or wax that makes floors slippery. If you must use wax, use non-skid floor wax. Do not have throw rugs and other things on the floor that can make you trip. What can I do with my stairs? Do not leave any items on the stairs. Make sure that there are handrails on both sides of the stairs and use them. Fix handrails that are broken or loose. Make sure that handrails are as long as the stairways. Check any carpeting to make sure that it is firmly attached to the stairs. Fix any carpet that is loose or worn. Avoid having throw rugs at the top or bottom of the stairs. If you do have throw rugs, attach them to the floor with carpet tape. Make sure that you have a light switch at the top of the stairs and the bottom of the stairs. If you do not have them, ask someone to add them for you. What else can I do to help prevent falls? Wear shoes that: Do not have high heels. Have  rubber bottoms. Are comfortable and fit you well. Are closed at the toe. Do not wear sandals. If you use a stepladder: Make sure that it is fully opened. Do not climb a closed stepladder. Make sure that both sides of the stepladder are locked into place. Ask someone to hold it for you, if possible. Clearly mark and make sure that you can see: Any grab bars or handrails. First and last steps. Where the edge of each step is. Use tools that help you move around (mobility aids) if they are needed. These include: Canes. Walkers. Scooters. Crutches. Turn on the lights when you go into a dark area. Replace any light bulbs as soon as they burn out. Set up your furniture so you have a clear path. Avoid moving your furniture around. If any of your floors are uneven, fix them. If there are any pets around you, be aware of where they are. Review your medicines with your doctor. Some medicines can make you feel dizzy. This can increase your chance of falling. Ask your doctor what other things that you can do to help prevent falls. This information is not intended to replace advice given to you by your health care provider. Make sure you discuss any questions you have with your health care provider. Document Released: 09/18/2009 Document Revised: 04/29/2016 Document Reviewed: 12/27/2014 Elsevier Interactive Patient Education  2017 [employer].

## 2022-08-25 NOTE — Progress Notes (Signed)
Subjective:   Destiny Cruz is a 70 y.o. female who presents for Medicare Annual (Subsequent) preventive examination.  Review of Systems     Cardiac Risk Factors include: advanced age (>78mn, >>55women);hypertension     Objective:    Today's Vitals   08/25/22 1552  BP: 120/60  Pulse: 60  Temp: 98.6 F (37 C)  TempSrc: Oral  SpO2: 98%  Weight: 117 lb 9.6 oz (53.3 kg)  Height: 5' 6"  (1.676 m)   Body mass index is 18.98 kg/m.     08/25/2022    4:01 PM 08/13/2021    9:14 AM 09/25/2020    2:45 PM 04/16/2019   12:55 PM 04/02/2019    5:48 AM  Advanced Directives  Does Patient Have a Medical Advance Directive? Yes Yes Yes Yes Yes  Type of AParamedicof ANew [place]ony[place] will HAm[place]Living will HHigh BridgeLiving will  Living will  Does patient want to make changes to medical advance directive?   No - Patient declined  No - Patient declined  Copy of HEnterprisein Chart? No - copy requested No - copy requested No - copy requested    Would patient like information on creating a medical advance directive?     No - Patient declined    Current Medications (verified) Outpatient Encounter Medications as of 08/25/2022  Medication Sig   ALPRAZolam (XANAX) 0.25 MG tablet Take 1-2 tablets (0.25-0.5 mg total) by mouth 2 (two) times daily as needed for anxiety.   amLODipine (NORVASC) 2.5 MG tablet Take 1 tablet (2.5 mg total) by mouth 2 (two) times daily.   Cholecalciferol (VITAMIN D-3 PO) Take 4,000 Units by mouth daily.   rosuvastatin (CRESTOR) 20 MG tablet TAKE 1 TABLET ONCE DAILY.   No facility-administered encounter medications on file as of 08/25/2022.    Allergies (verified) Keflex [cephalexin], Penicillins, and Sulfa antibiotics   History: Past Medical History:  Diagnosis Date   Chicken pox    Duodenal ulcer    clinical dx when younger responded to med and no recurrance no bleeding    Gestational diabetes    Gluten intolerance    possible celiac  see text    History of scarlatina    Hx of allergy    as a child hay fever    Migraines    UTI (lower urinary tract infection)    once    Past Surgical History:  Procedure Laterality Date   CESAREAN SECTION  11791,[id number]  Family History  Problem Relation Age of Onset   Colon cancer Father    Stroke Father    Rheum arthritis Mother    Thyroid disease Daughter        partial thyroidectomy    Celiac disease Son        probable   Social History   Socioeconomic History   Marital status: Married    Spouse name: Not on file   Number of children: Not on file   Years of education: Not on file   Highest education level: Not on file  Occupational History   Occupation: Retired  Tobacco Use   Smoking status: Never   Smokeless tobacco: Never  Vaping Use   Vaping Use: Never used  Substance and Sexual Activity   Alcohol use: Not Currently    Alcohol/week: 0.0 standard drinks of alcohol    Comment: 1-2 Glasses of wine per day   Drug use: No  Sexual activity: Yes    Partners: Male  Other Topics Concern   Not on file  Social History Narrative   Retired on 10/06/2015   Lives her husband and mother   Masters science degree  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    G2P2 c section   Mom has rheumatoid arthritis   Social Determinants of Radio broadcast assistant Strain: Low Risk  (08/25/2022)   Overall Financial Resource Strain (CARDIA)    Difficulty of Paying Living Expenses: Not hard at all  Food Insecurity: No Food Insecurity (08/25/2022)   Hunger Vital Sign    Worried About Running Out of Food in the Last Year: Never true    [place] in the Last Year: Never true  Transportation Needs: No Transportation Needs (08/25/2022)   PRAPARE - Hydrologist (Medical): No    Lack of Transportation (Non-Medical): No  Physical Activity: Sufficiently Active (08/25/2022)   Exercise Vital Sign    Days  of Exercise per Week: 7 days    Minutes of Exercise per Session: 30 min  Stress: No Stress Concern Present (08/25/2022)   [place]    Feeling of Stress : Not at all  Social Connections: Moderately Isolated (08/25/2022)   Social Connection and Isolation Panel [NHANES]    Frequency of Communication with Friends and Family: More than three times a week    Frequency of Social Gatherings with Friends and Family: More than three times a week    Attends Religious Services: Never    Marine scientist or Organizations: No    Attends Music therapist: Never    Marital Status: Married    Tobacco Counseling Counseling given: Not Answered   Clinical Intake:  Pre-visit preparation completed: Yes  Pain : No/denies pain     BMI - recorded: 18.98 Nutritional Status: BMI <19  Underweight Nutritional Risks: None Diabetes: No  How often do you need to have someone help you when you read instructions, pamphlets, or other written materials from your doctor or pharmacy?: 1 - Never  Diabetic?  No  Interpreter Needed?: No  Information entered by :: Rolene Arbour LPN   Activities of Daily Living    08/25/2022    3:59 PM 08/23/2022   11:32 AM  In your present state of health, do you have any difficulty performing the following activities:  Hearing? 0 0  Vision? 0 0  Difficulty concentrating or making decisions? 0 0  Walking or climbing stairs? 0 0  Dressing or bathing? 0 0  Doing errands, shopping? 0 0  Preparing Food and eating ? N N  Using the Toilet? N N  In the past six months, have you accidently leaked urine? N N  Do you have problems with loss of bowel control? N N  Managing your Medications? N N  Managing your Finances? N N  Housekeeping or managing your Housekeeping? N N    Patient Care Team: Panosh, Standley Brooking, MD as PCP - General (Internal Medicine) Debara Pickett Nadean Corwin, MD as PCP - Cardiology  (Cardiology) Clent Jacks, MD as Consulting Physician (Ophthalmology) Cameron Sprang, MD as Consulting Physician (Neurology) Debara Pickett Nadean Corwin, MD as Consulting Physician (Cardiology)  Indicate any recent Medical Services you may have received from other than [facility] providers in the past year (date may be approximate).     Assessment:   This is a routine wellness examination for Destiny Cruz.  Hearing/Vision screen Hearing Screening - Comments:: Denies hearing difficulties   Vision Screening - Comments:: Wears rx glasses - up to date with routine eye exams with  DR Katy Fitch  Dietary issues and exercise activities discussed: Current Exercise Habits: Home exercise routine, Type of exercise: walking, Time (Minutes): 30, Frequency (Times/Week): 7, Weekly Exercise (Minutes/Week): 210, Intensity: Moderate, Exercise limited by: None identified   Goals Addressed               This Visit's Progress     Stay Healthy (pt-stated)         Depression Screen    08/25/2022    3:57 PM 08/13/2021    9:15 AM 08/13/2021    9:12 AM 09/25/2020    2:49 PM 02/06/2018    1:16 PM 03/31/2016    1:50 PM  PHQ 2/9 Scores  PHQ - 2 Score 0 0 0 0 0 0  PHQ- 9 Score    0      Fall Risk    08/25/2022    4:00 PM 08/23/2022   11:32 AM 08/13/2021    9:15 AM 09/25/2020    2:47 PM 04/16/2019   12:54 PM  Fall Risk   Falls in the past year? 0 0 0 0 0  Number falls in past yr: 0  0 0 0  Injury with Fall? 0  0 0 0  Risk for fall due to : No Fall Risks  No Fall Risks No Fall Risks   Follow up Falls prevention discussed  Falls evaluation completed Falls evaluation completed;Falls prevention discussed     FALL RISK PREVENTION PERTAINING TO THE HOME:  Any stairs in or around the home? No  If so, are there any without handrails? No  Home free of loose throw rugs in walkways, pet beds, electrical cords, etc? Yes Adequate lighting in your home to reduce risk of falls? Yes   ASSISTIVE DEVICES UTILIZED TO PREVENT FALLS:  Life  alert? No  Use of a cane, walker or w/c? No  Grab bars in the bathroom? Yes  Shower chair or bench in shower? Yes  Elevated toilet seat or a handicapped toilet? No   TIMED UP AND GO:  Was the test performed? Yes .  Length of time to ambulate 10 feet: 10 sec.   Gait steady and fast without use of assistive device  Cognitive Function:        08/25/2022    4:01 PM  6CIT Screen  What Year? 0 points  What month? 0 points  What time? 0 points  Count back from 20 0 points  Months in reverse 0 points  Repeat phrase 0 points  Total Score 0 points    Immunizations Immunization History  Administered Date(s) Administered   Influenza, High Dose Seasonal PF 08/28/2016, 08/23/2017, 08/23/2018, 08/23/2018, 08/08/2019, 08/09/2020, 08/14/2021   Influenza-Unspecified 08/08/2019   Moderna Sars-Covid-2 Vaccination 11/05/2020   PFIZER(Purple Top)SARS-COV-2 Vaccination 01/10/2020, 02/05/2020   Pneumococcal Conjugate-13 12/03/2015   Pneumococcal Polysaccharide-23 12/09/2016   Td 03/16/2018   Tdap 03/16/2018    TDAP status: Up to date  Flu Vaccine status: Up to date  Pneumococcal vaccine status: Up to date  Covid-19 vaccine status: Completed vaccines  Qualifies for Shingles Vaccine? Yes   Zostavax completed No   Shingrix Completed?: No.    Education has been provided regarding the importance of this vaccine. Patient has been advised to call insurance company to determine out of pocket expense if they have not yet received this vaccine.  Advised may also receive vaccine at local pharmacy or Health Dept. Verbalized acceptance and understanding.  Screening Tests Health Maintenance  Topic Date Due   COVID-19 Vaccine (4 - Mixed Product risk series) 12/31/2020   Zoster Vaccines- Shingrix (1 of 2) 11/24/2022 (Originally 09/22/1970)   INFLUENZA VACCINE  03/06/2023 (Originally 07/06/2022)   COLONOSCOPY (Pts 45-11yr Insurance coverage will need to be confirmed)  08/26/2023 (Originally  09/22/1994)   MAMMOGRAM  04/17/2023   TETANUS/TDAP  03/16/2028   Pneumonia Vaccine 71 Years old  Completed   DEXA SCAN  Completed   Hepatitis C Screening  Completed   HPV VACCINES  Aged Out    Health Maintenance  Health Maintenance Due  Topic Date Due   COVID-19 Vaccine (4 - Mixed Product risk series) 12/31/2020    Colorectal cancer screening: Referral to GI placed Patient deferred. Pt aware the office will call re: appt.  Mammogram status: Completed 04/16/22. Repeat every year  Bone Density status: Completed 01/28/22. Results reflect: Bone density results: OSTEOPOROSIS. Repeat every   years.  Lung Cancer Screening: (Low Dose CT Chest recommended if Age 613-80years, 30 pack-year currently smoking OR have quit w/in 15years.) does not qualify.     Additional Screening:  Hepatitis C Screening: does qualify; Completed 12/11/15  Vision Screening: Recommended annual ophthalmology exams for early detection of glaucoma and other disorders of the eye. Is the patient up to date with their annual eye exam?  Yes  Who is the provider or what is the name of the office in which the patient attends annual eye exams? Dr GKaty FitchIf pt is not established with a provider, would they like to be referred to a provider to establish care? No .   Dental Screening: Recommended annual dental exams for proper oral hygiene  Community Resource Referral / Chronic Care Management:  CRR required this visit?  No   CCM required this visit?  No      Plan:     I have personally reviewed and noted the following in the patient's chart:   Medical and social history Use of alcohol, tobacco or illicit drugs  Current medications and supplements including opioid prescriptions. Patient is not currently taking opioid prescriptions. Functional ability and status Nutritional status Physical activity Advanced directives List of other physicians Hospitalizations, surgeries, and ER visits in previous 12  months Vitals Screenings to include cognitive, depression, and falls Referrals and appointments  In addition, I have reviewed and discussed with patient certain preventive protocols, quality metrics, and best practice recommendations. A written personalized care plan for preventive services as well as general preventive health recommendations were provided to patient.     BCriselda Peaches LPN   [id number]  Nurse Notes: None

## 2022-08-26 ENCOUNTER — Encounter: Payer: Self-pay | Admitting: Internal Medicine

## 2023-01-15 ENCOUNTER — Other Ambulatory Visit: Payer: Self-pay | Admitting: Internal Medicine

## 2023-01-18 DIAGNOSIS — H0288A Meibomian gland dysfunction right eye, upper and lower eyelids: Secondary | ICD-10-CM | POA: Diagnosis not present

## 2023-01-18 DIAGNOSIS — H0288B Meibomian gland dysfunction left eye, upper and lower eyelids: Secondary | ICD-10-CM | POA: Diagnosis not present

## 2023-01-18 DIAGNOSIS — H04123 Dry eye syndrome of bilateral lacrimal glands: Secondary | ICD-10-CM | POA: Diagnosis not present

## 2023-01-18 DIAGNOSIS — H2513 Age-related nuclear cataract, bilateral: Secondary | ICD-10-CM | POA: Diagnosis not present

## 2023-02-07 ENCOUNTER — Encounter (HOSPITAL_BASED_OUTPATIENT_CLINIC_OR_DEPARTMENT_OTHER): Payer: Self-pay | Admitting: Internal Medicine

## 2023-02-08 ENCOUNTER — Telehealth: Payer: Self-pay | Admitting: Internal Medicine

## 2023-02-08 ENCOUNTER — Other Ambulatory Visit: Payer: Self-pay

## 2023-02-08 DIAGNOSIS — E782 Mixed hyperlipidemia: Secondary | ICD-10-CM

## 2023-02-08 NOTE — Telephone Encounter (Signed)
  Patient has current lab orders for lipids that will expire in March. She will be coming in April for labs before her appt. Can new orders be placed since the other will be expired by then?

## 2023-02-08 NOTE — Telephone Encounter (Signed)
Called patient, LVM advising that order may expire, but I reordered just to be on the safe side.   Labs reordered. Advised patient to call back if needed, LVM to call back 03/05

## 2023-03-19 ENCOUNTER — Other Ambulatory Visit: Payer: Self-pay | Admitting: Internal Medicine

## 2023-03-23 ENCOUNTER — Other Ambulatory Visit: Payer: Self-pay

## 2023-03-23 DIAGNOSIS — E782 Mixed hyperlipidemia: Secondary | ICD-10-CM

## 2023-03-24 LAB — NMR, LIPOPROFILE
Cholesterol, Total: 175 mg/dL (ref 100–199)
HDL Particle Number: 48.6 umol/L (ref >=30.5)
HDL-C: 78 mg/dL (ref >39)
LDL Particle Number: 1233 nmol/L — ABNORMAL HIGH (ref <1000)
LDL Size: 20.7 nm (ref >20.5)
LDL-C (NIH Calc): 81 mg/dL (ref 0–99)
LP-IR Score: 57 — ABNORMAL HIGH (ref <=45)
Small LDL Particle Number: 672 nmol/L — ABNORMAL HIGH (ref <=527)
Triglycerides: 85 mg/dL (ref 0–149)

## 2023-03-29 ENCOUNTER — Other Ambulatory Visit: Payer: Self-pay | Admitting: Internal Medicine

## 2023-03-29 DIAGNOSIS — Z1231 Encounter for screening mammogram for malignant neoplasm of breast: Secondary | ICD-10-CM

## 2023-03-31 ENCOUNTER — Encounter: Payer: Self-pay | Admitting: Internal Medicine

## 2023-03-31 ENCOUNTER — Ambulatory Visit: Payer: PPO | Attending: Internal Medicine | Admitting: Internal Medicine

## 2023-03-31 VITALS — BP 108/72 | HR 57 | Ht 60.0 in | Wt 119.6 lb

## 2023-03-31 DIAGNOSIS — I2510 Atherosclerotic heart disease of native coronary artery without angina pectoris: Secondary | ICD-10-CM

## 2023-03-31 DIAGNOSIS — E785 Hyperlipidemia, unspecified: Secondary | ICD-10-CM

## 2023-03-31 DIAGNOSIS — I2584 Coronary atherosclerosis due to calcified coronary lesion: Secondary | ICD-10-CM | POA: Diagnosis not present

## 2023-03-31 DIAGNOSIS — I10 Essential (primary) hypertension: Secondary | ICD-10-CM

## 2023-03-31 NOTE — Progress Notes (Signed)
LIPID CLINIC CONSULT NOTE  Chief Complaint:  Follow-up dyslipidemia  Primary Care Physician: Madelin Headings, MD  Primary Cardiologist:  Chrystie Nose, MD  HPI:  Destiny Cruz is a 72 y.o. female who is being seen today for the evaluation of dyslipidemia at the request of Panosh, Neta Mends, MD. This is a pleasant 72 year old female who is a wife of a patient of mine.  She is followed by Dr. Elease Hashimoto and was referred for management of dyslipidemia.  She has a history of recent hypertension now managed on Micardis.  She does have longstanding dyslipidemia.  In fact she brought in a complex graft today which indicates her lipids that have been tracked back to 2002.  She has had persistently elevated LDL-C the lowest being 131 but is high as recently 246 as of August 2020.  Her total cholesterol has been persistently over 200 for years.  Recently she had coronary artery calcium scoring which was elevated at 114, intermediate risk and the 76 percentile based on age and sex matched controls.  She is completely asymptomatic from a cardiac standpoint.  Says she eats a healthy diet which is according to [facility] guidelines will not make any dietary modifications.  Recently she has lost weight and she seems to be comfortable with her current weight which is in range and acceptable BMI of 21.  Physical activity is minimal.  There is a family history of hemorrhagic stroke as well as heart disease which is concerning to her.  And the reason she presented was for further evaluation and management recommendations.  Most recently her lipid profile which was a lipid NMR showed a total cholesterol of 328, triglycerides 92, HDL 64 and LDL-C of 246.  Her LDL-P was 2722 nmol/L and a LPA was normal at 59.  APO B was significantly elevated 176 with an increase in small LDL-P of greater than 800.  Overall a significantly abnormal lipid profile suggesting increased cardiovascular risk.  01/11/2020  Destiny Cruz returns  today for follow-up.  Overall she is doing extremely well.  She has had significant reduction in her lipids on 20 mg of rosuvastatin but is also lost a lot of weight and changed her diet significantly.  She is down to 107 pounds today.  Her lipid NMR previously showed a LDL-P of 2723, now reduced to 836 particles.  LDL-C is now 81, HDL-C 64, small LDL-P is 316, triglycerides 83.  Overall marked improvement in her lipids and very near goal.  Best overall she is tolerating the statin without any side effects.  01/14/2021  Destiny Cruz is seen today in follow-up.  Her lipids continue to look very good.  Her total cholesterol is 153, HDL 64, LDL 70 and triglycerides 104.  Blood pressure is well controlled at home although was slightly elevated today.  She is on low-dose amlodipine primarily for Raynaud's and also for her blood pressure.  She is also followed by Dr. Elease Hashimoto.  02/22/2022  Destiny Cruz returns today for follow-up.  Cholesterol continues to be well treated.  Her LDL-P is 757, LDL-C76, HDL-C78 and triglycerides 92.  Overall an excellent lipid profile in fact the lowest LDL particle number she has had in a number of years.  She is tolerating the Crestor without any significant side effects.  Blood pressure was a little elevated today 144/72 however home readings are lower.  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  She reports that she wishes to follow  along with me since Dr. Elease Hashimoto will be retiring soon.  03/31/2023  Destiny Cruz is seen today in follow-up.  There is been an interim increase in her lipids.  LDL is gone up to 81 from 76 with an increase in particle number up to 1233 from 757.  Small LDL particle number is increased as well.  She reports that she has been taking care of her mother who is permanently in a nursing facility on hospice and diet has been not as ideal as it had been in the past.  She is now trying to work on that more aggressively.  She is compliant  with her statin.  PMHx:  Past Medical History:  Diagnosis Date   Chicken pox    Duodenal ulcer    clinical dx when younger responded to med and no recurrance no bleeding   Gestational diabetes    Gluten intolerance    possible celiac  see text    History of scarlatina    Hx of allergy    as a child hay fever    Migraines    UTI (lower urinary tract infection)    once     Past Surgical History:  Procedure Laterality Date   CESAREAN SECTION  1610,9604    FAMHx:  Family History  Problem Relation Age of Onset   Colon cancer Father    Stroke Father    Rheum arthritis Mother    Thyroid disease Daughter        partial thyroidectomy    Celiac disease Son        probable    SOCHx:   reports that she has never smoked. She has never used smokeless tobacco. She reports that she does not currently use alcohol. She reports that she does not use drugs.  ALLERGIES:  Allergies  Allergen Reactions   Keflex [Cephalexin] Hives   Penicillins Hives   Sulfa Antibiotics Hives    ROS: Pertinent items noted in HPI and remainder of comprehensive ROS otherwise negative.  HOME MEDS: Current Outpatient Medications on File Prior to Visit  Medication Sig Dispense Refill   amLODipine (NORVASC) 2.5 MG tablet Take 1 tablet (2.5 mg total) by mouth 2 (two) times daily. 180 tablet 3   Cholecalciferol (VITAMIN D-3 PO) Take 4,000 Units by mouth daily.     rosuvastatin (CRESTOR) 20 MG tablet TAKE ONE TABLET ONCE DAILY 90 tablet 3   ALPRAZolam (XANAX) 0.25 MG tablet Take 1-2 tablets (0.25-0.5 mg total) by mouth 2 (two) times daily as needed for anxiety. (Patient not taking: Reported on 03/31/2023) 15 tablet 0   No current facility-administered medications on file prior to visit.    LABS/IMAGING: No results found for this or any previous visit (from the past 48 hour(s)). No results found.  LIPID PANEL:    Component Value Date/Time   CHOL 240 (H) 01/26/2018 0750   TRIG 147.0 01/26/2018 0750    HDL 56.20 01/26/2018 0750   CHOLHDL 4 01/26/2018 0750   VLDL 29.4 01/26/2018 0750   LDLCALC 155 (H) 01/26/2018 0750    WEIGHTS: Wt Readings from Last 3 Encounters:  03/31/23 119 lb 9.6 oz (54.3 kg)  08/25/22 117 lb 9.6 oz (53.3 kg)  02/22/22 112 lb 12.8 oz (51.2 kg)    VITALS: BP 108/72 (BP Location: Right Arm, Patient Position: Sitting, Cuff Size: Normal)   Pulse (!) 57   Ht 5' (1.524 m)   Wt 119 lb 9.6 oz (54.3 kg)   SpO2 99%  BMI 23.36 kg/m   EXAM: Deferred  EKG: Deferred  ASSESSMENT: Mixed dyslipidemia, goal LDL less than 70 Elevated CAC score 114 (2020)-76th percentile for age and sex matched control Hypertension LAFB  PLAN: 1.   Destiny Cruz has had an increase in her lipids including particle numbers and small particle numbers.  This is mostly dietary and lifestyle related.  She is compliant with her statin.  We talked about ways to improve this and she will continue to work on that over the next 6 months.  Will plan to repeat lipid NMR at that time.  Follow-up with me in 6 months or sooner as necessary.  Chrystie Nose, MD, [facility], FACP  [place]  [facility]  Medical Director of the Advanced Lipid Disorders &  Cardiovascular Risk Reduction Clinic Diplomate of the American Board of Clinical Lipidology Attending Cardiologist  Direct Dial: [phone]  Fax: [phone]  Website:  [email or web address] Nicely Meridee Branum 03/31/2023, 10:42 AM

## 2023-03-31 NOTE — Patient Instructions (Signed)
Medication Instructions:  Your physician recommends that you continue on your current medications as directed. Please refer to the Current Medication list given to you today.  *If you need a refill on your cardiac medications before your next appointment, please call your pharmacy*   Lab Work: Fasting labs 1-2 weeks prior to appointment in 6 months with Dr. Rennis Golden  Our in office lab hours are Monday-Friday 8:00-4:00, closed for lunch 1-2 pm.  No appointment needed.  [facility] locations:   [facility] - [address]  - [address] ([facility]) - [address] [phone] [address]. [address]   [place] - [address]    [place]  - [address]    [place] - [address] - [address] [employer]  - [address] - [address] ([facility])  Follow-Up: At [facility], you and your health needs are our priority.  As part of our continuing mission to provide you with exceptional heart care, we have created designated Provider Care Teams.  These Care Teams include your primary Cardiologist (physician) and Advanced Practice Providers (APPs -  Physician Assistants and Nurse Practitioners) who all work together to provide you with the care you need, when you need it.  We recommend signing up for the patient portal called "MyChart".  Sign up information is provided on this After Visit Summary.  MyChart is used to connect with patients for Virtual Visits (Telemedicine).  Patients are able to view lab/test results, encounter notes, upcoming appointments, etc.  Non-urgent messages can be sent to your provider as well.   To learn more about what you can do with MyChart, go to [email or web address].    Your next appointment:   Dr. Rennis Golden recommends that you schedule a follow up visit with him the in the LIPID CLINIC in 6 months. Please have fasting  blood work about 1 week prior to this visit and he will review the blood work results with you at your appointment.

## 2023-04-20 ENCOUNTER — Ambulatory Visit
Admission: RE | Admit: 2023-04-20 | Discharge: 2023-04-20 | Disposition: A | Discharge status: Home / Self Care | Payer: PPO | Source: Ambulatory Visit

## 2023-04-20 DIAGNOSIS — Z1231 Encounter for screening mammogram for malignant neoplasm of breast: Secondary | ICD-10-CM | POA: Diagnosis not present

## 2023-07-20 ENCOUNTER — Encounter: Payer: Self-pay | Admitting: Internal Medicine

## 2023-07-21 ENCOUNTER — Encounter (INDEPENDENT_AMBULATORY_CARE_PROVIDER_SITE_OTHER): Payer: Self-pay

## 2023-07-25 NOTE — Telephone Encounter (Signed)
I agree  the wellness is a interveiw assessment  Schedule for a cpe  ( covered  yearly for advantage programs) Up to you to do the wellness check . Medicare and programs use this as a check on "quality" and payment parameters so this is why it is advised yearly.... I'm fine  with whatever you decide  but I agree a hands on visit with me  is a good idea .

## 2023-08-02 ENCOUNTER — Other Ambulatory Visit: Payer: Self-pay | Admitting: *Deleted

## 2023-08-02 DIAGNOSIS — E785 Hyperlipidemia, unspecified: Secondary | ICD-10-CM

## 2023-08-17 ENCOUNTER — Encounter: Payer: Self-pay | Admitting: Internal Medicine

## 2023-08-17 ENCOUNTER — Encounter: Payer: PPO | Admitting: Internal Medicine

## 2023-08-17 ENCOUNTER — Ambulatory Visit (INDEPENDENT_AMBULATORY_CARE_PROVIDER_SITE_OTHER): Payer: PPO | Admitting: Internal Medicine

## 2023-08-17 VITALS — BP 119/67 | HR 58 | Temp 98.1°F | Ht 60.5 in | Wt 122.4 lb

## 2023-08-17 DIAGNOSIS — E782 Mixed hyperlipidemia: Secondary | ICD-10-CM

## 2023-08-17 DIAGNOSIS — E559 Vitamin D deficiency, unspecified: Secondary | ICD-10-CM

## 2023-08-17 DIAGNOSIS — K900 Celiac disease: Secondary | ICD-10-CM

## 2023-08-17 DIAGNOSIS — Z0000 Encounter for general adult medical examination without abnormal findings: Secondary | ICD-10-CM | POA: Diagnosis not present

## 2023-08-17 DIAGNOSIS — Z79899 Other long term (current) drug therapy: Secondary | ICD-10-CM

## 2023-08-17 DIAGNOSIS — Z23 Encounter for immunization: Secondary | ICD-10-CM

## 2023-08-17 DIAGNOSIS — M810 Age-related osteoporosis without current pathological fracture: Secondary | ICD-10-CM

## 2023-08-17 DIAGNOSIS — I10 Essential (primary) hypertension: Secondary | ICD-10-CM | POA: Diagnosis not present

## 2023-08-17 DIAGNOSIS — M8580 Other specified disorders of bone density and structure, unspecified site: Secondary | ICD-10-CM | POA: Diagnosis not present

## 2023-08-17 DIAGNOSIS — Z8632 Personal history of gestational diabetes: Secondary | ICD-10-CM | POA: Diagnosis not present

## 2023-08-17 NOTE — Progress Notes (Signed)
Chief Complaint  Patient presents with   Annual Exam    Pt reports she will have fsting blood work with her cardiology next month.     HPI: Patient  Destiny Cruz  72 y.o. comes in today for Preventive Health Care visit  Last pv 2022  Card cac dyslipidemia , sees   Dr Rennis Golden  yearly  a bit higher ldl particle  on crestor..20 mg   Raynauds not new mom has RA she does not Hx gest dm  hx lsi no diabetes  Hx ht labile  but hda been controlled  up and down recently  hx of suspected celiac cause of fam hx po gene and sx with gluten so on gluten free for years  no hx of iron deficiency  Declines colon cancer screening,  Has low bone density declines meds  walks  and vitamin D Health Maintenance  Topic Date Due   Medicare Annual Wellness (AWV)  08/26/2023   Colonoscopy  08/26/2023 (Originally 09/22/1994)   COVID-19 Vaccine (4 - 2023-24 season) 09/02/2023 (Originally 08/07/2023)   Zoster Vaccines- Shingrix (1 of 2) 11/16/2023 (Originally 09/22/1970)   MAMMOGRAM  04/19/2024   DTaP/Tdap/Td (3 - Td or Tdap) 03/16/2028   Pneumonia Vaccine 61+ Years old  Completed   INFLUENZA VACCINE  Completed   DEXA SCAN  Completed   Hepatitis C Screening  Completed   HPV VACCINES  Aged Out   Health Maintenance Review LIFESTYLE:  Exercise:   moderate .  Walks  with husband and other   bone dexa  Tobacco/ETS: no Alcohol:  wine every day  Sugar beverages: no Sleep: 7-9  Drug use: no HH of   2   [place]  hospice care  in 90s  has ra and frailty but cognitive good  Work: retired 2016      ROS:  REST of 12 system review negative except as per HPI   Past Medical History:  Diagnosis Date   Chicken pox    Duodenal ulcer    clinical dx when younger responded to med and no recurrance no bleeding   Gestational diabetes    Gluten intolerance    possible celiac  see text    History of scarlatina    Hx of allergy    as a child hay fever    Migraines    UTI (lower urinary tract  infection)    once     Past Surgical History:  Procedure Laterality Date   CESAREAN SECTION  4098,1191    Family History  Problem Relation Age of Onset   Colon cancer Father    Stroke Father    Rheum arthritis Mother    Thyroid disease Daughter        partial thyroidectomy    Celiac disease Son        probable    Social History   Socioeconomic History   Marital status: Married    Spouse name: Not on file   Number of children: Not on file   Years of education: Not on file   Highest education level: Master's degree (e.g., MA, MS, MEng, MEd, MSW, MBA)  Occupational History   Occupation: Retired  Tobacco Use   Smoking status: Never   Smokeless tobacco: Never  Vaping Use   Vaping status: Never Used  Substance and Sexual Activity   Alcohol use: Not Currently    Alcohol/week: 0.0 standard drinks of alcohol    Comment: 1-2 Glasses of  wine per day   Drug use: No   Sexual activity: Yes    Partners: Male  Other Topics Concern   Not on file  Social History Narrative   Retired on 10/06/2015   Lives her husband and mother   Masters science degree  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    G2P2 c section   Mom has rheumatoid arthritis   Social Determinants of Corporate investment banker Strain: Low Risk  (08/12/2023)   Overall Financial Resource Strain (CARDIA)    Difficulty of Paying Living Expenses: Not hard at all  Food Insecurity: No Food Insecurity (08/12/2023)   Hunger Vital Sign    Worried About Running Out of Food in the Last Year: Never true    Ran Out of Food in the Last Year: Never true  Transportation Needs: No Transportation Needs (08/12/2023)   PRAPARE - Administrator, Civil Service (Medical): No    Lack of Transportation (Non-Medical): No  Physical Activity: Sufficiently Active (08/12/2023)   Exercise Vital Sign    Days of Exercise per Week: 7 days    Minutes of Exercise per Session: 30 min  Stress: No Stress Concern Present (08/12/2023)   Destiny Cruz  of Occupational Health - Occupational Stress Questionnaire    Feeling of Stress : Not at all  Social Connections: Unknown (08/12/2023)   Social Connection and Isolation Panel [NHANES]    Frequency of Communication with Friends and Family: More than three times a week    Frequency of Social Gatherings with Friends and Family: More than three times a week    Attends Religious Services: Patient declined    Database administrator or Organizations: Yes    Attends Banker Meetings: Never    Marital Status: Married    Outpatient Medications Prior to Visit  Medication Sig Dispense Refill   amLODipine (NORVASC) 2.5 MG tablet Take 1 tablet (2.5 mg total) by mouth 2 (two) times daily. (Patient taking differently: Take 2.5 mg by mouth 2 (two) times daily. Taking 2 tablet at night.) 180 tablet 3   rosuvastatin (CRESTOR) 20 MG tablet TAKE ONE TABLET ONCE DAILY 90 tablet 3   ALPRAZolam (XANAX) 0.25 MG tablet Take 1-2 tablets (0.25-0.5 mg total) by mouth 2 (two) times daily as needed for anxiety. (Patient not taking: Reported on 03/31/2023) 15 tablet 0   Cholecalciferol (VITAMIN D-3 PO) Take 4,000 Units by mouth daily. (Patient not taking: Reported on 08/17/2023)     No facility-administered medications prior to visit.     EXAM:  BP 119/67 Comment: BP from home this morning  Pulse (!) 58   Temp 98.1 F (36.7 C) (Oral)   Ht 5' 0.5" (1.537 m)   Wt 122 lb 6.4 oz (55.5 kg)   SpO2 98%   BMI 23.51 kg/m   Body mass index is 23.51 kg/m. Wt Readings from Last 3 Encounters:  08/17/23 122 lb 6.4 oz (55.5 kg)  03/31/23 119 lb 9.6 oz (54.3 kg)  08/25/22 117 lb 9.6 oz (53.3 kg)    Physical Exam: Vital signs reviewed EXB:MWUX is a well-developed well-nourished alert cooperative    who appearsr stated age in no acute distress.  HEENT: normocephalic atraumatic , Eyes: PERRL EOM's full, conjunctiva clear, Nares: paten,t no deformity discharge or tenderness., Ears: no deformity EAC's clear TMs  with normal landmarks. Mouth: clear OP, no lesions, edema.  Moist mucous membranes. Dentition in adequate repair. NECK: supple without masses, thyromegaly or bruits. CHEST/PULM:  Clear to auscultation and percussion breath sounds equal no wheeze , rales or rhonchi. No chest wall deformities or tenderness. Breast: normal by inspection . No dimpling, discharge, masses, tenderness or discharge . CV: PMI is nondisplaced, S1 S2 no gallops, murmurs, rubs. Peripheral pulses are full without delay.No JVD .  ABDOMEN: Bowel sounds normal nontender  No guard or rebound, no hepato splenomegal no CVA tenderness.   Extremtities:  No clubbing cyanosis or edema, no acute joint swelling or redness no focal atrophy NEURO:  Oriented x3, cranial nerves 3-12 appear to be intact, no obvious focal weakness,gait within normal limits no abnormal reflexes or asymmetrical SKIN: No acute rashes normal turgor, color, no bruising or petechiae. PSYCH: Oriented, good eye contact, no obvious depression anxiety, cognition and judgment appear normal. LN: no cervical axillary  adenopathy  Lab Results  Component Value Date   WBC 5.6 09/21/2021   HGB 13.5 09/21/2021   HCT 40.7 09/21/2021   PLT 170.0 09/21/2021   GLUCOSE 110 (H) 09/21/2021   CHOL 240 (H) 01/26/2018   TRIG 147.0 01/26/2018   HDL 56.20 01/26/2018   LDLCALC 155 (H) 01/26/2018   ALT 14 09/21/2021   AST 17 09/21/2021   NA 143 09/21/2021   K 4.1 09/21/2021   CL 106 09/21/2021   CREATININE 0.96 09/21/2021   BUN 14 09/21/2021   CO2 29 09/21/2021   TSH 1.40 09/21/2021   HGBA1C 5.4 09/21/2021    BP Readings from Last 3 Encounters:  08/17/23 119/67  03/31/23 108/72  08/25/22 120/60  Ate last lunch watermelon.  Coffee  will proceed with lab excluding  lipid to be done by Dr Rennis Golden  Lab rplan reviewed with patient   ASSESSMENT AND PLAN:  Discussed the following assessment and plan:    ICD-10-CM   1. Visit for preventive health examination  Z00.00      2. Medication management  Z79.899 Basic metabolic panel    CBC with Differential/Platelet    Hemoglobin A1c    Hepatic function panel    TSH    Vitamin D, 25-hydroxy    IBC + Ferritin    3. Mixed hyperlipidemia  E78.2 Basic metabolic panel    CBC with Differential/Platelet    Hemoglobin A1c    Hepatic function panel    TSH    Vitamin D, 25-hydroxy    4. Essential hypertension  I10 Basic metabolic panel    CBC with Differential/Platelet    Hemoglobin A1c    Hepatic function panel    TSH    Vitamin D, 25-hydroxy   elev in office reports in range at home will monitor  and fu as approbiate if still high 5 mg per day amlodipine    5. Influenza vaccine needed  Z23 Flu Vaccine Trivalent High Dose (Fluad)    6. Low bone mass  M85.80 Basic metabolic panel    CBC with Differential/Platelet    Hemoglobin A1c    Hepatic function panel    TSH    Vitamin D, 25-hydroxy    IBC + Ferritin    7. Vitamin D deficiency  E55.9 Basic metabolic panel    CBC with Differential/Platelet    Hemoglobin A1c    Hepatic function panel    TSH    Vitamin D, 25-hydroxy    8. Hx gestational diabetes  Z86.32 Basic metabolic panel    CBC with Differential/Platelet    Hemoglobin A1c    Hepatic function panel    TSH    Vitamin D,  25-hydroxy    9. Age related osteoporosis, unspecified pathological fracture presence  M81.0 Basic metabolic panel    CBC with Differential/Platelet    Hemoglobin A1c    Hepatic function panel    TSH    Vitamin D, 25-hydroxy    10. Celiac disease presumed  K90.0 Basic metabolic panel    CBC with Differential/Platelet    Hemoglobin A1c    Hepatic function panel    TSH    Vitamin D, 25-hydroxy    IBC + Ferritin   pos genetic  no bx done  ? serology gluten free for decades  pos fam hx    Reviewed HCM and declined colon screen and any med's for bone building  Advise resistance training . Flu vaccine today  Return in about 1 year (around 08/16/2024) for depending on  results.  Patient Care Team: Jaris Kohles, Neta Mends, MD as PCP - General (Internal Medicine) Rennis Golden Lisette Abu, MD as PCP - Cardiology (Cardiology) Ernesto Rutherford, MD as Consulting Physician (Ophthalmology) Van Clines, MD as Consulting Physician (Neurology) Rennis Golden Lisette Abu, MD as Consulting Physician (Cardiology) Patient Instructions  Good to see you today. NF lab except for lipids .  Continue lifestyle intervention healthy eating and exercise . Bp monitoring to ensure in range .    If all ok then yearly check   Neta Mends. Aylen Rambert M.D.

## 2023-08-17 NOTE — Patient Instructions (Signed)
Good to see you today. NF lab except for lipids .  Continue lifestyle intervention healthy eating and exercise . Bp monitoring to ensure in range .    If all ok then yearly check

## 2023-08-18 LAB — CBC WITH DIFFERENTIAL/PLATELET
Basophils Absolute: 0.1 10*3/uL (ref 0.0–0.1)
Basophils Relative: 0.8 % (ref 0.0–3.0)
Eosinophils Absolute: 0.2 10*3/uL (ref 0.0–0.7)
Eosinophils Relative: 3.3 % (ref 0.0–5.0)
HCT: 41.3 % (ref 36.0–46.0)
Hemoglobin: 13.4 g/dL (ref 12.0–15.0)
Lymphocytes Relative: 18.3 % (ref 12.0–46.0)
Lymphs Abs: 1.4 10*3/uL (ref 0.7–4.0)
MCHC: 32.5 g/dL (ref 30.0–36.0)
MCV: 93.1 fl (ref 78.0–100.0)
Monocytes Absolute: 0.7 10*3/uL (ref 0.1–1.0)
Monocytes Relative: 8.8 % (ref 3.0–12.0)
Neutro Abs: 5.2 10*3/uL (ref 1.4–7.7)
Neutrophils Relative %: 68.8 % (ref 43.0–77.0)
Platelets: 201 10*3/uL (ref 150.0–400.0)
RBC: 4.43 Mil/uL (ref 3.87–5.11)
RDW: 12.5 % (ref 11.5–15.5)
WBC: 7.5 10*3/uL (ref 4.0–10.5)

## 2023-08-18 LAB — IBC + FERRITIN
Ferritin: 168.2 ng/mL (ref 10.0–291.0)
Iron: 116 ug/dL (ref 42–145)
Saturation Ratios: 34.5 % (ref 20.0–50.0)
TIBC: 336 ug/dL (ref 250.0–450.0)
Transferrin: 240 mg/dL (ref 212.0–360.0)

## 2023-08-18 LAB — BASIC METABOLIC PANEL
BUN: 22 mg/dL (ref 6–23)
CO2: 27 meq/L (ref 19–32)
Calcium: 9.7 mg/dL (ref 8.4–10.5)
Chloride: 101 meq/L (ref 96–112)
Creatinine, Ser: 1.17 mg/dL (ref 0.40–1.20)
GFR: 46.16 mL/min — ABNORMAL LOW (ref >60.00)
Glucose, Bld: 83 mg/dL (ref 70–99)
Potassium: 4.7 meq/L (ref 3.5–5.1)
Sodium: 139 meq/L (ref 135–145)

## 2023-08-18 LAB — HEPATIC FUNCTION PANEL
ALT: 17 U/L (ref 0–35)
AST: 19 U/L (ref 0–37)
Albumin: 4.4 g/dL (ref 3.5–5.2)
Alkaline Phosphatase: 77 U/L (ref 39–117)
Bilirubin, Direct: 0 mg/dL (ref 0.0–0.3)
Total Bilirubin: 0.4 mg/dL (ref 0.2–1.2)
Total Protein: 7.2 g/dL (ref 6.0–8.3)

## 2023-08-18 LAB — TSH: TSH: 1.67 u[IU]/mL (ref 0.35–5.50)

## 2023-08-18 LAB — VITAMIN D 25 HYDROXY (VIT D DEFICIENCY, FRACTURES): VITD: 66.42 ng/mL (ref 30.00–100.00)

## 2023-08-18 LAB — HEMOGLOBIN A1C: Hgb A1c MFr Bld: 5.3 % (ref 4.6–6.5)

## 2023-08-19 ENCOUNTER — Other Ambulatory Visit: Payer: Self-pay

## 2023-08-19 ENCOUNTER — Encounter: Payer: Self-pay | Admitting: Internal Medicine

## 2023-08-19 DIAGNOSIS — R944 Abnormal results of kidney function studies: Secondary | ICD-10-CM

## 2023-08-19 DIAGNOSIS — I10 Essential (primary) hypertension: Secondary | ICD-10-CM

## 2023-08-19 NOTE — Progress Notes (Signed)
Results :normal thyroid no diabetes ; iron levels and vit d  in range  but gfr is decreased still.  Needs to be followed . And continue BP control  Suggest bmp , and cystatin C , urine microalbumin ,  in about 3 months to follow  GFR .  Dx dec gfr and HT ( dont need visit if doing ok) forwarding fyi to dr Rennis Golden

## 2023-09-01 ENCOUNTER — Encounter: Payer: Self-pay | Admitting: Internal Medicine

## 2023-09-01 ENCOUNTER — Encounter (HOSPITAL_BASED_OUTPATIENT_CLINIC_OR_DEPARTMENT_OTHER): Payer: Self-pay | Admitting: Internal Medicine

## 2023-09-05 ENCOUNTER — Encounter: Payer: Self-pay | Admitting: Internal Medicine

## 2023-09-27 DIAGNOSIS — E785 Hyperlipidemia, unspecified: Secondary | ICD-10-CM | POA: Diagnosis not present

## 2023-09-28 LAB — NMR, LIPOPROFILE
Cholesterol, Total: 165 mg/dL (ref 100–199)
HDL Particle Number: 46.4 umol/L (ref >=30.5)
HDL-C: 74 mg/dL (ref >39)
LDL Particle Number: 722 nmol/L (ref <1000)
LDL Size: 20.1 nmol — ABNORMAL LOW (ref >20.5)
LDL-C (NIH Calc): 70 mg/dL (ref 0–99)
LP-IR Score: 50 — ABNORMAL HIGH (ref <=45)
Small LDL Particle Number: 388 nmol/L (ref <=527)
Triglycerides: 120 mg/dL (ref 0–149)

## 2023-10-05 ENCOUNTER — Ambulatory Visit (HOSPITAL_BASED_OUTPATIENT_CLINIC_OR_DEPARTMENT_OTHER): Payer: PPO | Admitting: Internal Medicine

## 2023-10-05 ENCOUNTER — Encounter (HOSPITAL_BASED_OUTPATIENT_CLINIC_OR_DEPARTMENT_OTHER): Payer: Self-pay | Admitting: Internal Medicine

## 2023-10-05 VITALS — BP 126/82 | HR 57 | Ht 60.6 in | Wt 124.2 lb

## 2023-10-05 DIAGNOSIS — E785 Hyperlipidemia, unspecified: Secondary | ICD-10-CM | POA: Diagnosis not present

## 2023-10-05 DIAGNOSIS — I10 Essential (primary) hypertension: Secondary | ICD-10-CM | POA: Diagnosis not present

## 2023-10-05 DIAGNOSIS — I2510 Atherosclerotic heart disease of native coronary artery without angina pectoris: Secondary | ICD-10-CM

## 2023-10-05 MED ORDER — AMLODIPINE BESYLATE 5 MG PO TABS: 5.0000 mg | ORAL_TABLET | Freq: Every day | ORAL | 3 refills | Status: DC

## 2023-10-05 NOTE — Progress Notes (Signed)
LIPID CLINIC CONSULT NOTE  Chief Complaint:  Follow-up dyslipidemia  Primary Care Physician: Madelin Headings, MD  Primary Cardiologist:  Chrystie Nose, MD  HPI:  Destiny Cruz is a 72 y.o. female who is being seen today for the evaluation of dyslipidemia at the request of Panosh, Neta Mends, MD. This is a pleasant 72 year old female who is a wife of a patient of mine.  She is followed by Dr. Elease Hashimoto and was referred for management of dyslipidemia.  She has a history of recent hypertension now managed on Micardis.  She does have longstanding dyslipidemia.  In fact she brought in a complex graft today which indicates her lipids that have been tracked back to 2002.  She has had persistently elevated LDL-C the lowest being 131 but is high as recently 246 as of August 2020.  Her total cholesterol has been persistently over 200 for years.  Recently she had coronary artery calcium scoring which was elevated at 114, intermediate risk and the 76 percentile based on age and sex matched controls.  She is completely asymptomatic from a cardiac standpoint.  Says she eats a healthy diet which is according to [facility] guidelines will not make any dietary modifications.  Recently she has lost weight and she seems to be comfortable with her current weight which is in range and acceptable BMI of 21.  Physical activity is minimal.  There is a family history of hemorrhagic stroke as well as heart disease which is concerning to her.  And the reason she presented was for further evaluation and management recommendations.  Most recently her lipid profile which was a lipid NMR showed a total cholesterol of 328, triglycerides 92, HDL 64 and LDL-C of 246.  Her LDL-P was 2722 nmol/L and a LPA was normal at 59.  APO B was significantly elevated 176 with an increase in small LDL-P of greater than 800.  Overall a significantly abnormal lipid profile suggesting increased cardiovascular risk.  01/11/2020  Destiny Cruz returns  today for follow-up.  Overall she is doing extremely well.  She has had significant reduction in her lipids on 20 mg of rosuvastatin but is also lost a lot of weight and changed her diet significantly.  She is down to 107 pounds today.  Her lipid NMR previously showed a LDL-P of 2723, now reduced to 836 particles.  LDL-C is now 81, HDL-C 64, small LDL-P is 316, triglycerides 83.  Overall marked improvement in her lipids and very near goal.  Best overall she is tolerating the statin without any side effects.  01/14/2021  Destiny Cruz is seen today in follow-up.  Her lipids continue to look very good.  Her total cholesterol is 153, HDL 64, LDL 70 and triglycerides 104.  Blood pressure is well controlled at home although was slightly elevated today.  She is on low-dose amlodipine primarily for Raynaud's and also for her blood pressure.  She is also followed by Dr. Elease Hashimoto.  02/22/2022  Destiny Cruz returns today for follow-up.  Cholesterol continues to be well treated.  Her LDL-P is 757, LDL-C76, HDL-C78 and triglycerides 92.  Overall an excellent lipid profile in fact the lowest LDL particle number she has had in a number of years.  She is tolerating the Crestor without any significant side effects.  Blood pressure was a little elevated today 144/72 however home readings are lower.  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  She reports that she wishes to follow  along with me since Dr. Elease Hashimoto will be retiring soon.  03/31/2023  Destiny Cruz is seen today in follow-up.  There is been an interim increase in her lipids.  LDL is gone up to 81 from 76 with an increase in particle number up to 1233 from 757.  Small LDL particle number is increased as well.  She reports that she has been taking care of her mother who is permanently in a nursing facility on hospice and diet has been not as ideal as it had been in the past.  She is now trying to work on that more aggressively.  She is compliant  with her statin.  10/05/2023  Destiny Cruz is seen today for follow-up.  She had recent repeat lipid testing.  Fortunately her cholesterol has improved again.  Her last readings were a little higher.  LDL particle number is now 722 with an LDL-C of 70, HDL 74 and triglycerides 120.  She tells me that she gave up a gluten-free diet after practicing that for about 15 years after undergoing a gluten challenge realizing that she was not gluten sensitive.  Otherwise she is doing well and has been asymptomatic.  Blood pressure is well-controlled today.  PMHx:  Past Medical History:  Diagnosis Date   Chicken pox    Duodenal ulcer    clinical dx when younger responded to med and no recurrance no bleeding   Gestational diabetes    Gluten intolerance    possible celiac  see text    History of scarlatina    Hx of allergy    as a child hay fever    Migraines    UTI (lower urinary tract infection)    once     Past Surgical History:  Procedure Laterality Date   CESAREAN SECTION  1610,9604    FAMHx:  Family History  Problem Relation Age of Onset   Colon cancer Father    Stroke Father    Rheum arthritis Mother    Thyroid disease Daughter        partial thyroidectomy    Celiac disease Son        probable    SOCHx:   reports that she has never smoked. She has never used smokeless tobacco. She reports that she does not currently use alcohol. She reports that she does not use drugs.  ALLERGIES:  Allergies  Allergen Reactions   Keflex [Cephalexin] Hives   Penicillins Hives   Sulfa Antibiotics Hives    ROS: Pertinent items noted in HPI and remainder of comprehensive ROS otherwise negative.  HOME MEDS: Current Outpatient Medications on File Prior to Visit  Medication Sig Dispense Refill   ALPRAZolam (XANAX) 0.25 MG tablet Take 1-2 tablets (0.25-0.5 mg total) by mouth 2 (two) times daily as needed for anxiety. 15 tablet 0   amLODipine (NORVASC) 2.5 MG tablet Take 1 tablet (2.5 mg  total) by mouth 2 (two) times daily. (Patient taking differently: Take 2.5 mg by mouth 2 (two) times daily. Taking 2 tablet at night. "I have been taking 2.5 mg together to equal 5mg  once daily".) 180 tablet 3   Cholecalciferol (VITAMIN D-3 PO) Take 4,000 Units by mouth daily.     rosuvastatin (CRESTOR) 20 MG tablet TAKE ONE TABLET ONCE DAILY 90 tablet 3   No current facility-administered medications on file prior to visit.    LABS/IMAGING: No results found for this or any previous visit (from the past 48 hour(s)). No results found.  LIPID PANEL:  Component Value Date/Time   CHOL 240 (H) 01/26/2018 0750   TRIG 147.0 01/26/2018 0750   HDL 56.20 01/26/2018 0750   CHOLHDL 4 01/26/2018 0750   VLDL 29.4 01/26/2018 0750   LDLCALC 155 (H) 01/26/2018 0750    WEIGHTS: Wt Readings from Last 3 Encounters:  10/05/23 124 lb 3.2 oz (56.3 kg)  08/17/23 122 lb 6.4 oz (55.5 kg)  03/31/23 119 lb 9.6 oz (54.3 kg)    VITALS: BP 126/82   Pulse (!) 57   Ht 5' 0.6" (1.539 m)   Wt 124 lb 3.2 oz (56.3 kg)   SpO2 98%   BMI 23.78 kg/m   EXAM: Deferred  EKG: Deferred  ASSESSMENT: Mixed dyslipidemia, goal LDL less than 70 Elevated CAC score 114 (2020)-76th percentile for age and sex matched control Hypertension LAFB  PLAN: 1.   Destiny Cruz again has adequate control of her cholesterol with LDL at target.  I will continue her current therapy including lifestyle and dietary modifications.  She has requested that we prescribe the 5 mg amlodipine as she is doing well on 2.5 mg twice daily with good blood pressure control today.  I did that prescription for her.   Follow-up with me annually or sooner as necessary.  Chrystie Nose, MD, [facility], FACP  [place]  [facility]  Medical Director of the Advanced Lipid Disorders &  Cardiovascular Risk Reduction Clinic Diplomate of the American Board of Clinical Lipidology Attending Cardiologist  Direct Dial: [phone]  Fax:  [phone]  Website:  [email or web address]  Lisette Abu Tationna Fullard 10/05/2023, 9:05 AM

## 2023-10-05 NOTE — Patient Instructions (Signed)
Medication Instructions:  Prescription for amlodipine has been changed to one - 5mg  tablet daily  *If you need a refill on your cardiac medications before your next appointment, please call your pharmacy*   Lab Work: FASTING NMR lipoprofile in 6 months -- complete about 1 week before next visit  If you have labs (blood work) drawn today and your tests are completely normal, you will receive your results only by: MyChart Message (if you have MyChart) OR A paper copy in the mail If you have any lab test that is abnormal or we need to change your treatment, we will call you to review the results.   Follow-Up: At [facility], you and your health needs are our priority.  As part of our continuing mission to provide you with exceptional heart care, we have created designated Provider Care Teams.  These Care Teams include your primary Cardiologist (physician) and Advanced Practice Providers (APPs -  Physician Assistants and Nurse Practitioners) who all work together to provide you with the care you need, when you need it.  We recommend signing up for the patient portal called "MyChart".  Sign up information is provided on this After Visit Summary.  MyChart is used to connect with patients for Virtual Visits (Telemedicine).  Patients are able to view lab/test results, encounter notes, upcoming appointments, etc.  Non-urgent messages can be sent to your provider as well.   To learn more about what you can do with MyChart, go to [email or web address].    Your next appointment:   6 months with Dr. Rennis Golden  ** call in December for April/May appointment

## 2023-10-26 ENCOUNTER — Encounter: Payer: Self-pay | Admitting: Internal Medicine

## 2023-11-18 ENCOUNTER — Other Ambulatory Visit (INDEPENDENT_AMBULATORY_CARE_PROVIDER_SITE_OTHER): Payer: PPO

## 2023-11-18 DIAGNOSIS — R944 Abnormal results of kidney function studies: Secondary | ICD-10-CM | POA: Diagnosis not present

## 2023-11-18 DIAGNOSIS — I10 Essential (primary) hypertension: Secondary | ICD-10-CM | POA: Diagnosis not present

## 2023-11-18 LAB — BASIC METABOLIC PANEL
BUN: 11 mg/dL (ref 6–23)
CO2: 30 meq/L (ref 19–32)
Calcium: 9.2 mg/dL (ref 8.4–10.5)
Chloride: 104 meq/L (ref 96–112)
Creatinine, Ser: 1.06 mg/dL (ref 0.40–1.20)
GFR: 51.88 mL/min — ABNORMAL LOW (ref >60.00)
Glucose, Bld: 91 mg/dL (ref 70–99)
Potassium: 4.9 meq/L (ref 3.5–5.1)
Sodium: 141 meq/L (ref 135–145)

## 2023-11-18 LAB — MICROALBUMIN / CREATININE URINE RATIO
Creatinine,U: 47.3 mg/dL
Microalb Creat Ratio: 1.5 mg/g (ref 0.0–30.0)
Microalb, Ur: <0.7 mg/dL (ref 0.0–1.9)

## 2023-11-19 LAB — CYSTATIN C: CYSTATIN C: 0.82 mg/L (ref 0.78–1.15)

## 2024-01-24 DIAGNOSIS — H5203 Hypermetropia, bilateral: Secondary | ICD-10-CM | POA: Diagnosis not present

## 2024-01-24 DIAGNOSIS — H2513 Age-related nuclear cataract, bilateral: Secondary | ICD-10-CM | POA: Diagnosis not present

## 2024-02-05 ENCOUNTER — Other Ambulatory Visit: Payer: Self-pay | Admitting: Internal Medicine

## 2024-02-06 ENCOUNTER — Encounter: Payer: Self-pay | Admitting: Internal Medicine

## 2024-02-06 ENCOUNTER — Other Ambulatory Visit: Payer: Self-pay | Admitting: *Deleted

## 2024-02-06 ENCOUNTER — Other Ambulatory Visit: Payer: Self-pay | Admitting: Family

## 2024-02-06 DIAGNOSIS — E785 Hyperlipidemia, unspecified: Secondary | ICD-10-CM

## 2024-02-06 MED ORDER — ALPRAZOLAM 0.25 MG PO TABS: 0.2500 mg | ORAL_TABLET | Freq: Two times a day (BID) | ORAL | 0 refills | Status: AC | PRN

## 2024-02-29 ENCOUNTER — Encounter (HOSPITAL_BASED_OUTPATIENT_CLINIC_OR_DEPARTMENT_OTHER): Payer: Self-pay | Admitting: Internal Medicine

## 2024-03-11 ENCOUNTER — Encounter (HOSPITAL_BASED_OUTPATIENT_CLINIC_OR_DEPARTMENT_OTHER): Payer: Self-pay | Admitting: Internal Medicine

## 2024-03-11 DIAGNOSIS — E785 Hyperlipidemia, unspecified: Secondary | ICD-10-CM

## 2024-03-17 ENCOUNTER — Other Ambulatory Visit: Payer: Self-pay | Admitting: Internal Medicine

## 2024-03-26 ENCOUNTER — Other Ambulatory Visit: Payer: Self-pay | Admitting: Internal Medicine

## 2024-03-26 DIAGNOSIS — Z0000 Encounter for general adult medical examination without abnormal findings: Secondary | ICD-10-CM

## 2024-03-27 DIAGNOSIS — E785 Hyperlipidemia, unspecified: Secondary | ICD-10-CM | POA: Diagnosis not present

## 2024-03-28 LAB — NMR, LIPOPROFILE
Cholesterol, Total: 161 mg/dL (ref 100–199)
HDL Particle Number: 43.8 umol/L (ref >=30.5)
HDL-C: 77 mg/dL (ref >39)
LDL Particle Number: 890 nmol/L (ref <1000)
LDL Size: 20.5 nm — ABNORMAL LOW (ref >20.5)
LDL-C (NIH Calc): 69 mg/dL (ref 0–99)
LP-IR Score: 36 (ref <=45)
Small LDL Particle Number: 351 nmol/L (ref <=527)
Triglycerides: 79 mg/dL (ref 0–149)

## 2024-03-28 LAB — LIPID PANEL+APOB
Apolipoprotein B: 68 mg/dL (ref <90)
Cholesterol, Total: 162 mg/dL (ref 100–199)
HDL-C: 77 mg/dL (ref >39)
LDL-C (NIH Calc): 70 mg/dL (ref 0–99)
Non-HDL Cholesterol: 85 mg/dL (ref 0–129)
Triglycerides: 78 mg/dL (ref 0–149)

## 2024-04-06 ENCOUNTER — Encounter (HOSPITAL_BASED_OUTPATIENT_CLINIC_OR_DEPARTMENT_OTHER): Payer: Self-pay | Admitting: Internal Medicine

## 2024-04-06 ENCOUNTER — Ambulatory Visit (HOSPITAL_BASED_OUTPATIENT_CLINIC_OR_DEPARTMENT_OTHER): Payer: PPO | Admitting: Internal Medicine

## 2024-04-06 VITALS — BP 140/70 | HR 72 | Ht 61.0 in | Wt 127.0 lb

## 2024-04-06 DIAGNOSIS — I2510 Atherosclerotic heart disease of native coronary artery without angina pectoris: Secondary | ICD-10-CM | POA: Diagnosis not present

## 2024-04-06 DIAGNOSIS — E785 Hyperlipidemia, unspecified: Secondary | ICD-10-CM | POA: Diagnosis not present

## 2024-04-06 DIAGNOSIS — I10 Essential (primary) hypertension: Secondary | ICD-10-CM | POA: Diagnosis not present

## 2024-04-06 NOTE — Progress Notes (Signed)
 LIPID CLINIC CONSULT NOTE  Chief Complaint:  Follow-up dyslipidemia  Primary Care Physician: Reginal Capra, MD  Primary Cardiologist:  Hazle Lites, MD  HPI:  Destiny Cruz is a 73 y.o. female who is being seen today for the evaluation of dyslipidemia at the request of Panosh, Wanda K, MD. This is a pleasant 73 year old female who is a wife of a patient of mine.  She is followed by Dr. Alroy Aspen and was referred for management of dyslipidemia.  She has a history of recent hypertension now managed on Micardis .  She does have longstanding dyslipidemia.  In fact she brought in a complex graft today which indicates her lipids that have been tracked back to 2002.  She has had persistently elevated LDL-C the lowest being 131 but is high as recently 246 as of August 2020.  Her total cholesterol has been persistently over 200 for years.  Recently she had coronary artery calcium  scoring which was elevated at 114, intermediate risk and the 76 percentile based on age and sex matched controls.  She is completely asymptomatic from a cardiac standpoint.  Says she eats a healthy diet which is according to [facility] guidelines will not make any dietary modifications.  Recently she has lost weight and she seems to be comfortable with her current weight which is in range and acceptable BMI of 21.  Physical activity is minimal.  There is a family history of hemorrhagic stroke as well as heart disease which is concerning to her.  And the reason she presented was for further evaluation and management recommendations.  Most recently her lipid profile which was a lipid NMR showed a total cholesterol of 328, triglycerides 92, HDL 64 and LDL-C of 246.  Her LDL-P was 2722 nmol/L and a LPA was normal at 59.  APO B was significantly elevated 176 with an increase in small LDL-P of greater than 800.  Overall a significantly abnormal lipid profile suggesting increased cardiovascular risk.  01/11/2020  Destiny Cruz returns  today for follow-up.  Overall she is doing extremely well.  She has had significant reduction in her lipids on 20 mg of rosuvastatin  but is also lost a lot of weight and changed her diet significantly.  She is down to 107 pounds today.  Her lipid NMR previously showed a LDL-P of 2723, now reduced to 836 particles.  LDL-C is now 81, HDL-C 64, small LDL-P is 316, triglycerides 83.  Overall marked improvement in her lipids and very near goal.  Best overall she is tolerating the statin without any side effects.  01/14/2021  Destiny Cruz is seen today in follow-up.  Her lipids continue to look very good.  Her total cholesterol is 153, HDL 64, LDL 70 and triglycerides 104.  Blood pressure is well controlled at home although was slightly elevated today.  She is on low-dose amlodipine  primarily for Raynaud's and also for her blood pressure.  She is also followed by Dr. Alroy Aspen.  02/22/2022  Destiny Cruz returns today for follow-up.  Cholesterol continues to be well treated.  Her LDL-P is 757, LDL-C76, HDL-C78 and triglycerides 92.  Overall an excellent lipid profile in fact the lowest LDL particle number she has had in a number of years.  She is tolerating the Crestor  without any significant side effects.  Blood pressure was a little elevated today 144/72 however home readings are lower.  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  She reports that she wishes to follow  along with me since Dr. Alroy Aspen will be retiring soon.  03/31/2023  Destiny Cruz is seen today in follow-up.  There is been an interim increase in her lipids.  LDL is gone up to 81 from 76 with an increase in particle number up to 1233 from 757.  Small LDL particle number is increased as well.  She reports that she has been taking care of her mother who is permanently in a nursing facility on hospice and diet has been not as ideal as it had been in the past.  She is now trying to work on that more aggressively.  She is compliant  with her statin.  10/05/2023  Destiny Cruz is seen today for follow-up.  She had recent repeat lipid testing.  Fortunately her cholesterol has improved again.  Her last readings were a little higher.  LDL particle number is now 722 with an LDL-C of 70, HDL 74 and triglycerides 120.  She tells me that she gave up a gluten-free diet after practicing that for about 15 years after undergoing a gluten challenge realizing that she was not gluten sensitive.  Otherwise she is doing well and has been asymptomatic.  Blood pressure is well-controlled today.  04/06/2024  Destiny Cruz returns today for follow-up.  Overall she seems to be doing well.  Her lipids again are well-controlled.  LDL particle #890, LDL 69, HDL 77 and triglycerides 79.  Blood pressure is 140/70 today however home blood pressure reading was around 133 systolic.  She is on amlodipine  and rosuvastatin .  She denies any symptoms such as chest pain or worsening shortness of breath.  PMHx:  Past Medical History:  Diagnosis Date   Chicken pox    Duodenal ulcer    clinical dx when younger responded to med and no recurrance no bleeding   Gestational diabetes    Gluten intolerance    possible celiac  see text    History of scarlatina    Hx of allergy    as a child hay fever    Migraines    UTI (lower urinary tract infection)    once     Past Surgical History:  Procedure Laterality Date   CESAREAN SECTION  9604,5409    FAMHx:  Family History  Problem Relation Age of Onset   Colon cancer Father    Stroke Father    Rheum arthritis Mother    Thyroid  disease Daughter        partial thyroidectomy    Celiac disease Son        probable    SOCHx:   reports that she has never smoked. She has never used smokeless tobacco. She reports that she does not currently use alcohol. She reports that she does not use drugs.  ALLERGIES:  Allergies  Allergen Reactions   Keflex [Cephalexin] Hives   Penicillins Hives   Sulfa Antibiotics  Hives    ROS: Pertinent items noted in HPI and remainder of comprehensive ROS otherwise negative.  HOME MEDS: Current Outpatient Medications on File Prior to Visit  Medication Sig Dispense Refill   ALPRAZolam  (XANAX ) 0.25 MG tablet Take 1-2 tablets (0.25-0.5 mg total) by mouth 2 (two) times daily as needed for anxiety. 15 tablet 0   amLODipine  (NORVASC ) 5 MG tablet Take 1 tablet (5 mg total) by mouth daily. 90 tablet 3   Cholecalciferol (VITAMIN D -3 PO) Take 4,000 Units by mouth daily.     rosuvastatin  (CRESTOR ) 20 MG tablet TAKE ONE TABLET ONCE DAILY 90  tablet 1   No current facility-administered medications on file prior to visit.    LABS/IMAGING: No results found for this or any previous visit (from the past 48 hours). No results found.  LIPID PANEL:    Component Value Date/Time   CHOL 240 (H) 01/26/2018 0750   TRIG 147.0 01/26/2018 0750   HDL 56.20 01/26/2018 0750   CHOLHDL 4 01/26/2018 0750   VLDL 29.4 01/26/2018 0750   LDLCALC 155 (H) 01/26/2018 0750    WEIGHTS: Wt Readings from Last 3 Encounters:  04/06/24 127 lb (57.6 kg)  10/05/23 124 lb 3.2 oz (56.3 kg)  08/17/23 122 lb 6.4 oz (55.5 kg)    VITALS: BP (!) 140/70   Pulse 72   Ht 5\' 1"  (1.549 m)   Wt 127 lb (57.6 kg)   SpO2 96%   BMI 24.00 kg/m   EXAM: Deferred  EKG: Deferred  ASSESSMENT: Mixed dyslipidemia, goal LDL less than 70 Elevated CAC score 114 (2020)-76th percentile for age and sex matched control Hypertension LAFB  PLAN: 1.   Destiny Cruz has had good control of her lipids and her LDL is at target.  Blood pressure is appears better controlled at home.  Although she has coronary calcium  she is asymptomatic denying chest pain or shortness of breath at this time.  She remains active.  No changes to her medicines today.  Follow-up with me annually or sooner as necessary.  Hazle Lites, MD, [facility], FNLA, FACP  [place]  [facility]  Medical Director of the Advanced Lipid  Disorders &  Cardiovascular Risk Reduction Clinic Diplomate of the American Board of Clinical Lipidology Attending Cardiologist  Direct Dial: [phone]  Fax: [phone]  Website:  [email or web address] 04/06/2024, 11:38 AM

## 2024-04-06 NOTE — Patient Instructions (Signed)
 Medication Instructions:  Your physician recommends that you continue on your current medications as directed. Please refer to the Current Medication list given to you today.  *If you need a refill on your cardiac medications before your next appointment, please call your pharmacy*  Follow-Up: At [facility], you and your health needs are our priority.  As part of our continuing mission to provide you with exceptional heart care, our providers are all part of one team.  This team includes your primary Cardiologist (physician) and Advanced Practice Providers or APPs (Physician Assistants and Nurse Practitioners) who all work together to provide you with the care you need, when you need it.  Your next appointment:   12 month(s)  Provider:   Hazle Lites, MD

## 2024-04-16 ENCOUNTER — Encounter (HOSPITAL_COMMUNITY): Payer: Self-pay

## 2024-04-23 ENCOUNTER — Ambulatory Visit
Admission: RE | Admit: 2024-04-23 | Discharge: 2024-04-23 | Disposition: A | Discharge status: Home / Self Care | Source: Ambulatory Visit | Attending: Internal Medicine | Admitting: Internal Medicine

## 2024-04-23 DIAGNOSIS — Z1231 Encounter for screening mammogram for malignant neoplasm of breast: Secondary | ICD-10-CM | POA: Diagnosis not present

## 2024-04-23 DIAGNOSIS — Z0000 Encounter for general adult medical examination without abnormal findings: Secondary | ICD-10-CM

## 2024-05-18 ENCOUNTER — Encounter: Payer: Self-pay | Admitting: Internal Medicine

## 2024-09-08 ENCOUNTER — Other Ambulatory Visit: Payer: Self-pay | Admitting: Internal Medicine

## 2024-09-27 ENCOUNTER — Other Ambulatory Visit (HOSPITAL_BASED_OUTPATIENT_CLINIC_OR_DEPARTMENT_OTHER): Payer: Self-pay | Admitting: Internal Medicine

## 2024-12-07 NOTE — Progress Notes (Signed)
 Destiny Cruz                                          MRN: [id number]   12/07/2024   The [facility] Quality Team Specialist reviewed this patient medical record for the purposes of chart review for care gap closure. The following were reviewed: chart review for care gap closure-controlling blood pressure.    [facility]BCI Quality Team

## 2024-12-18 ENCOUNTER — Telehealth: Payer: Self-pay | Admitting: Internal Medicine

## 2024-12-18 DIAGNOSIS — E785 Hyperlipidemia, unspecified: Secondary | ICD-10-CM

## 2024-12-18 NOTE — Telephone Encounter (Signed)
 Patient will need lipid labs ordered before her appt on 04/15/25

## 2024-12-19 NOTE — Addendum Note (Signed)
 Addended by: LORING ANDRIETTE HERO on: 12/19/2024 08:03 AM   Modules accepted: Orders

## 2024-12-19 NOTE — Telephone Encounter (Signed)
 FLP ordered per MD. MyChart message sent to patient w/update.

## 2025-04-15 ENCOUNTER — Ambulatory Visit: Admitting: Internal Medicine
